# Patient Record
Sex: Female | Born: 2000
Health system: Southern US, Community
[De-identification: ages and names within clinical notes are randomized; demographics above are authoritative.]

## PROBLEM LIST (undated history)

## (undated) DIAGNOSIS — I10 Essential (primary) hypertension: Secondary | ICD-10-CM

---
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
## 2020-01-26 IMAGING — CR DG FOOT COMPLETE 3+V*R*
3 series · 3 of 3 positions shown · non-contrast
Comparison: None.

CLINICAL DATA: Foot pain

EXAM:
RIGHT FOOT COMPLETE - 3+ VIEW

[x foot lat right]
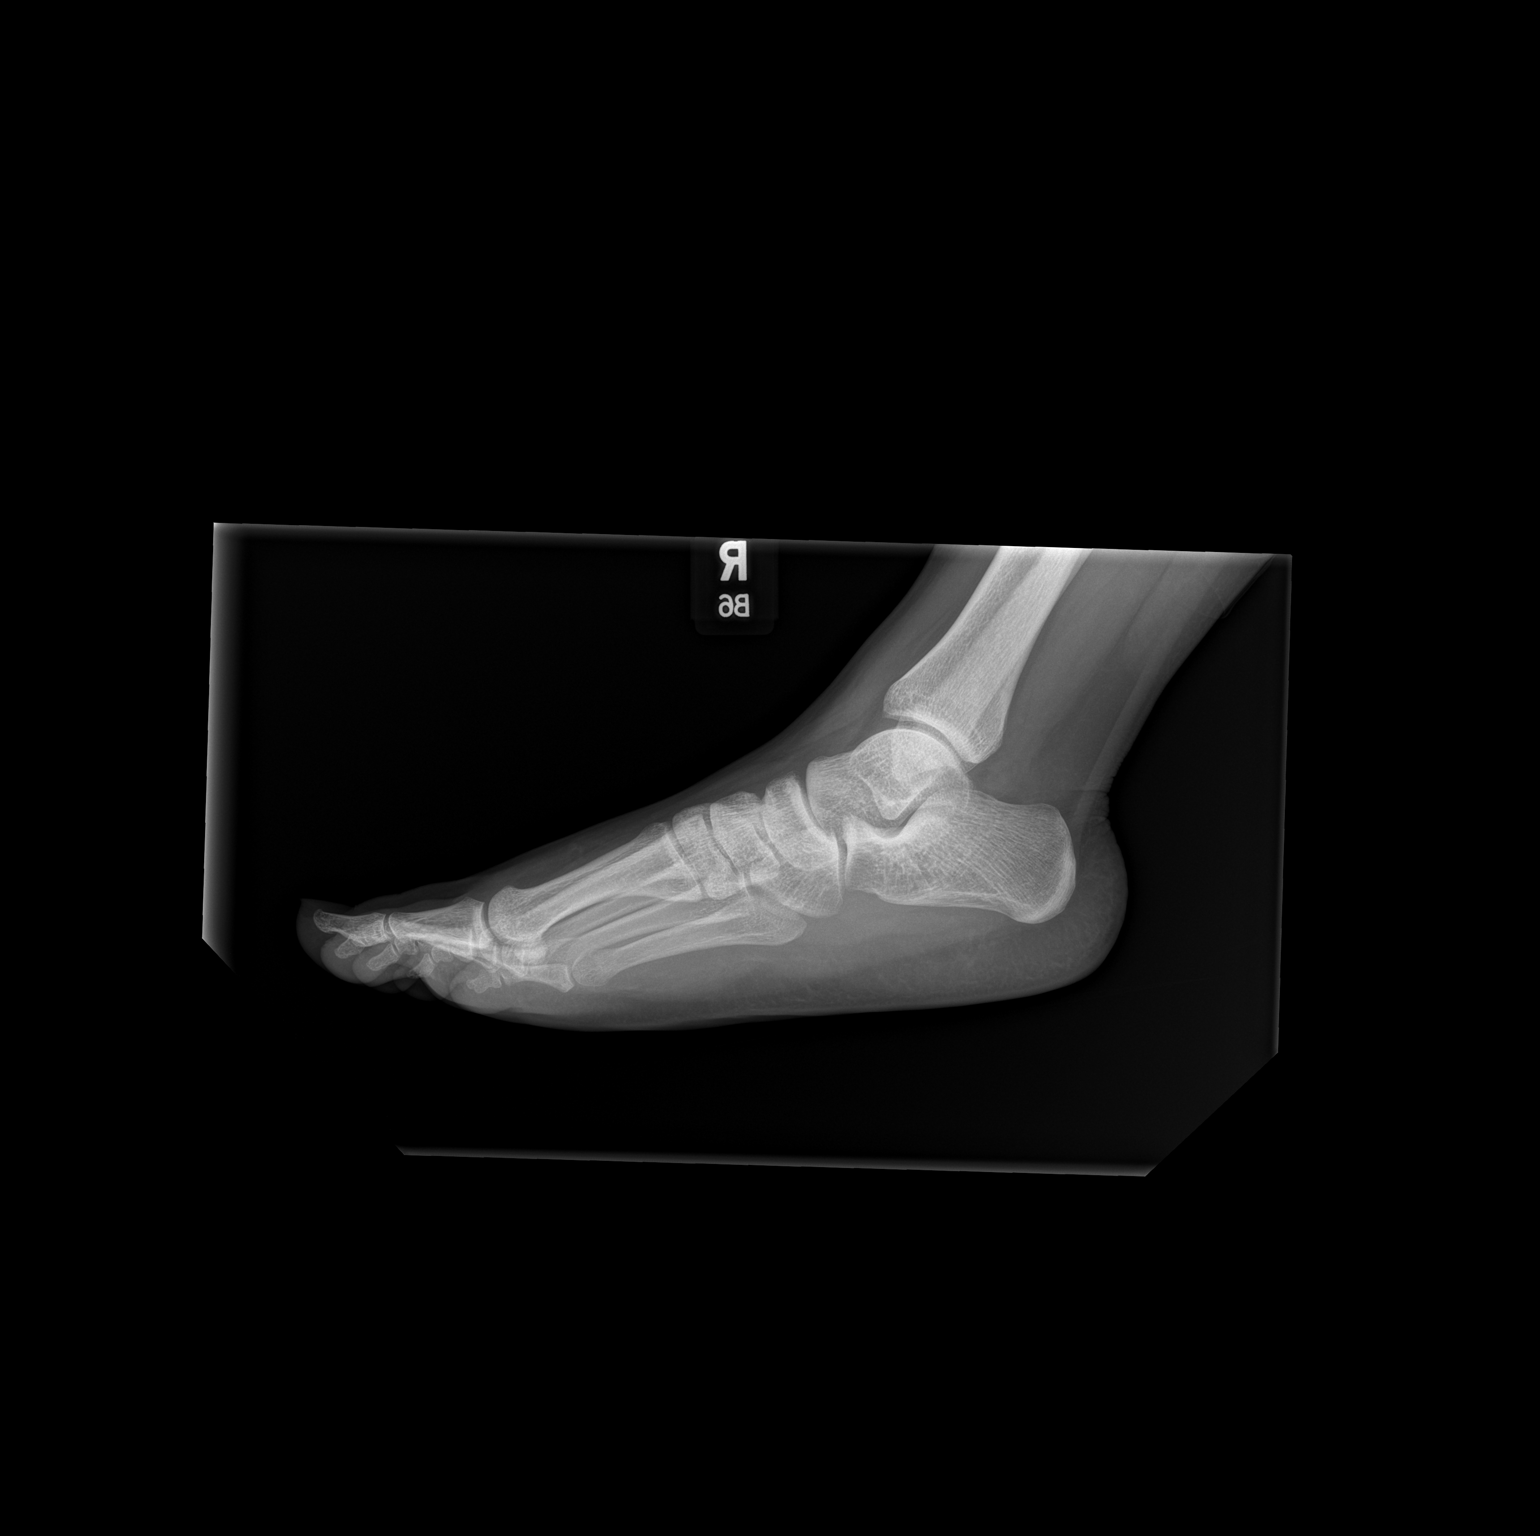

[x foot ap right]
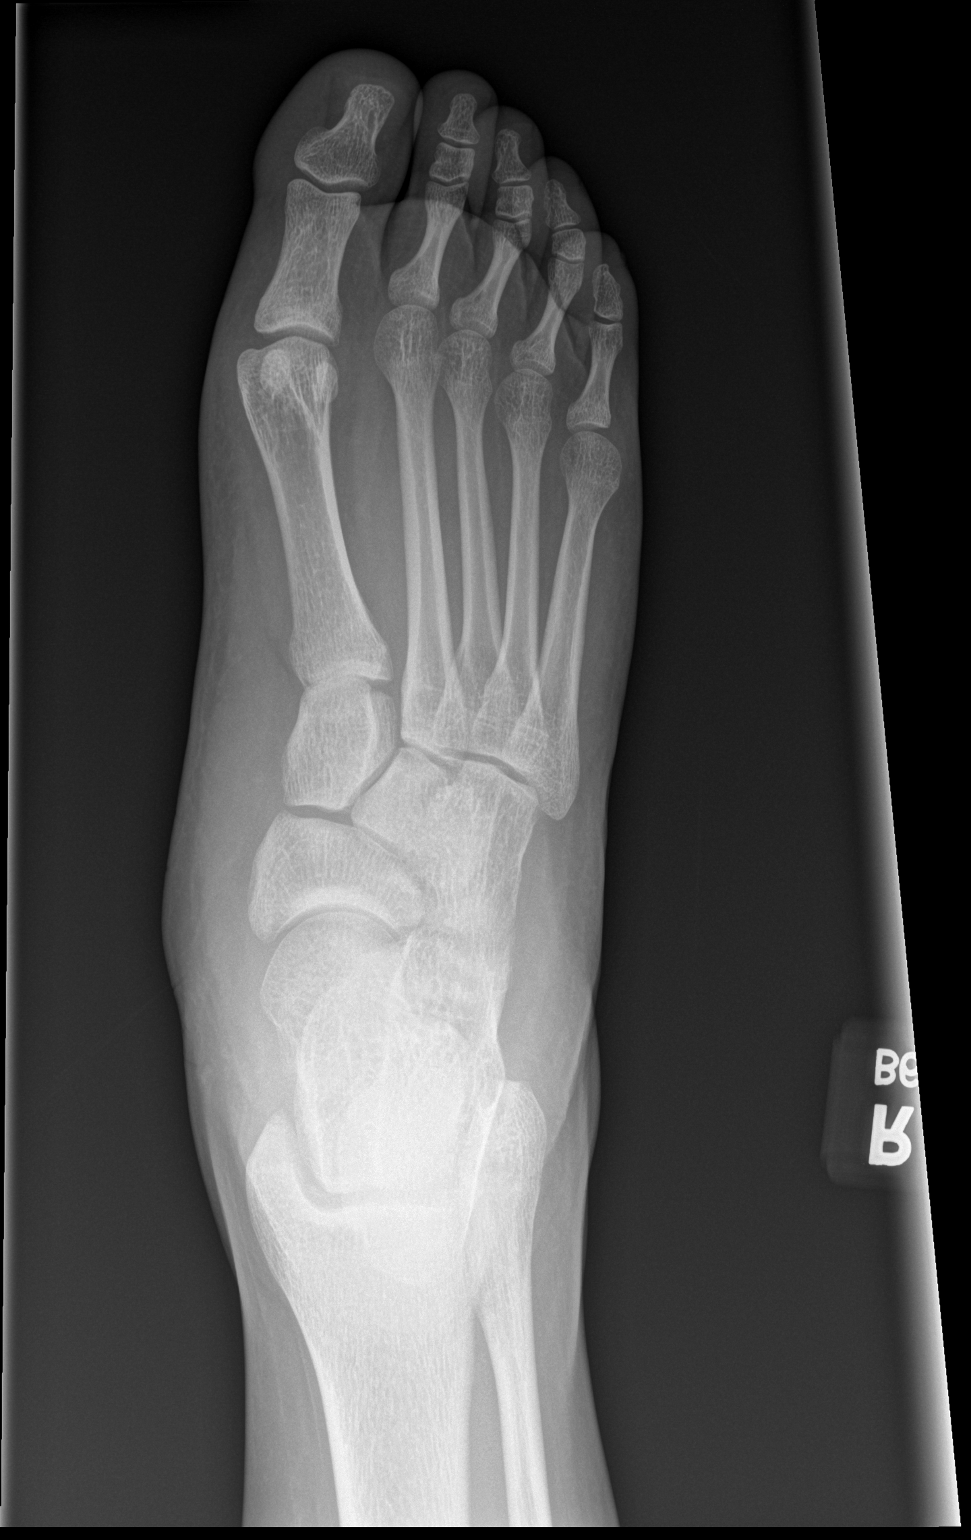

[x foot obl right]
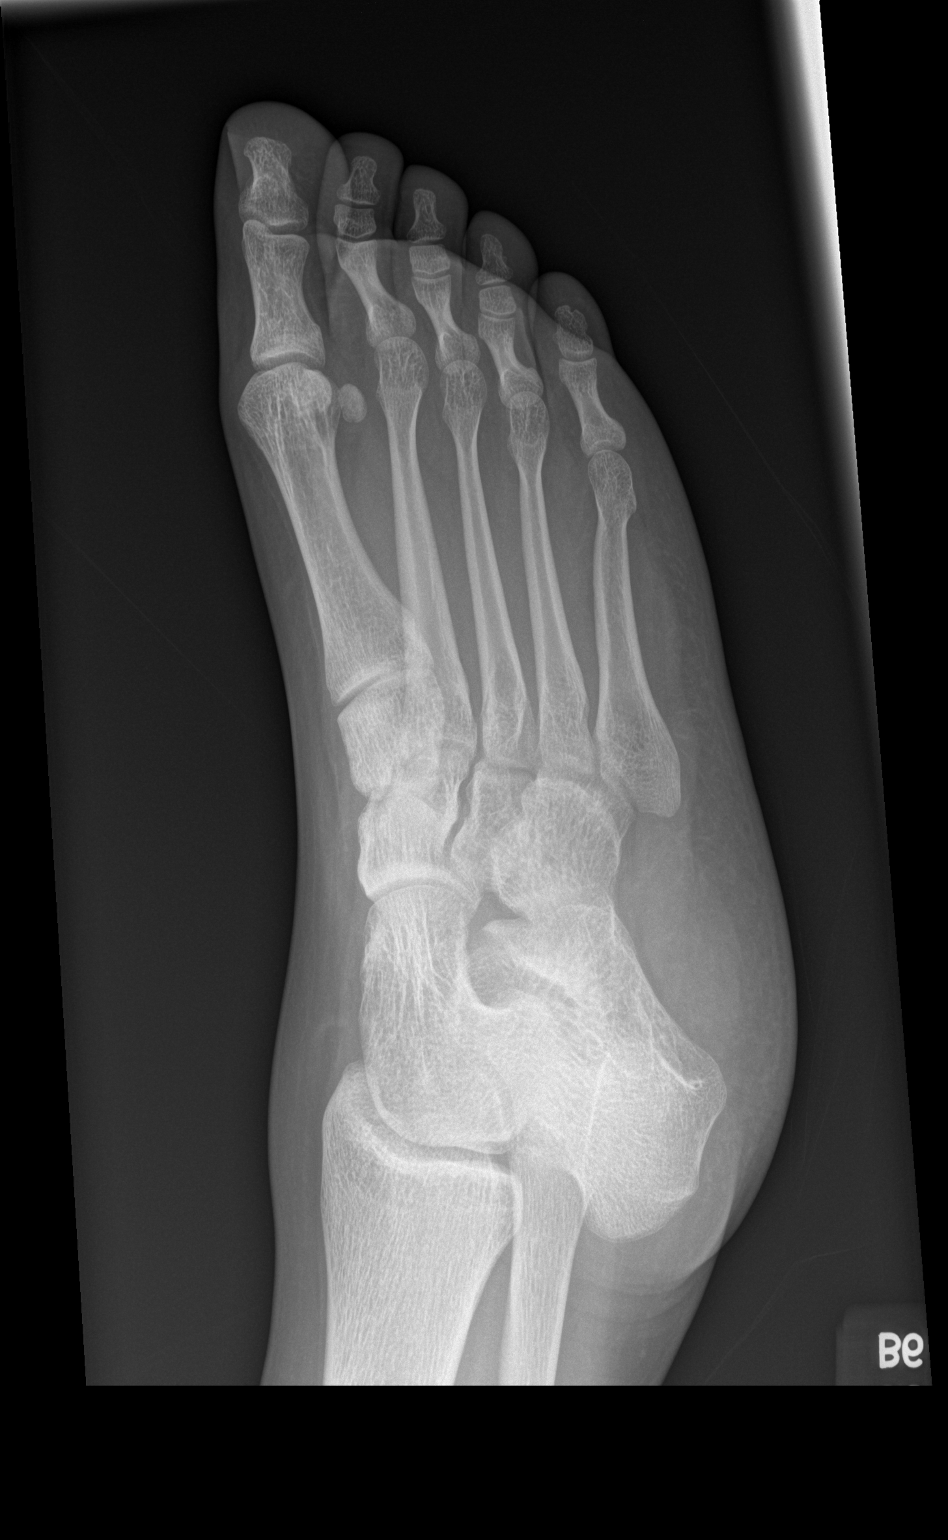

[3 of 3 positions shown; findings below may reference images not displayed]

FINDINGS: There is no evidence of fracture or dislocation. There is no
evidence of arthropathy or other focal bone abnormality. Soft
tissues are unremarkable.
IMPRESSION: Negative.

## 2020-01-26 IMAGING — CR DG ANKLE COMPLETE 3+V*R*
3 series · 3 of 3 positions shown · non-contrast
Comparison: None.

CLINICAL DATA: Ankle injury

EXAM:
RIGHT ANKLE - COMPLETE 3+ VIEW

[x ankle lat right]
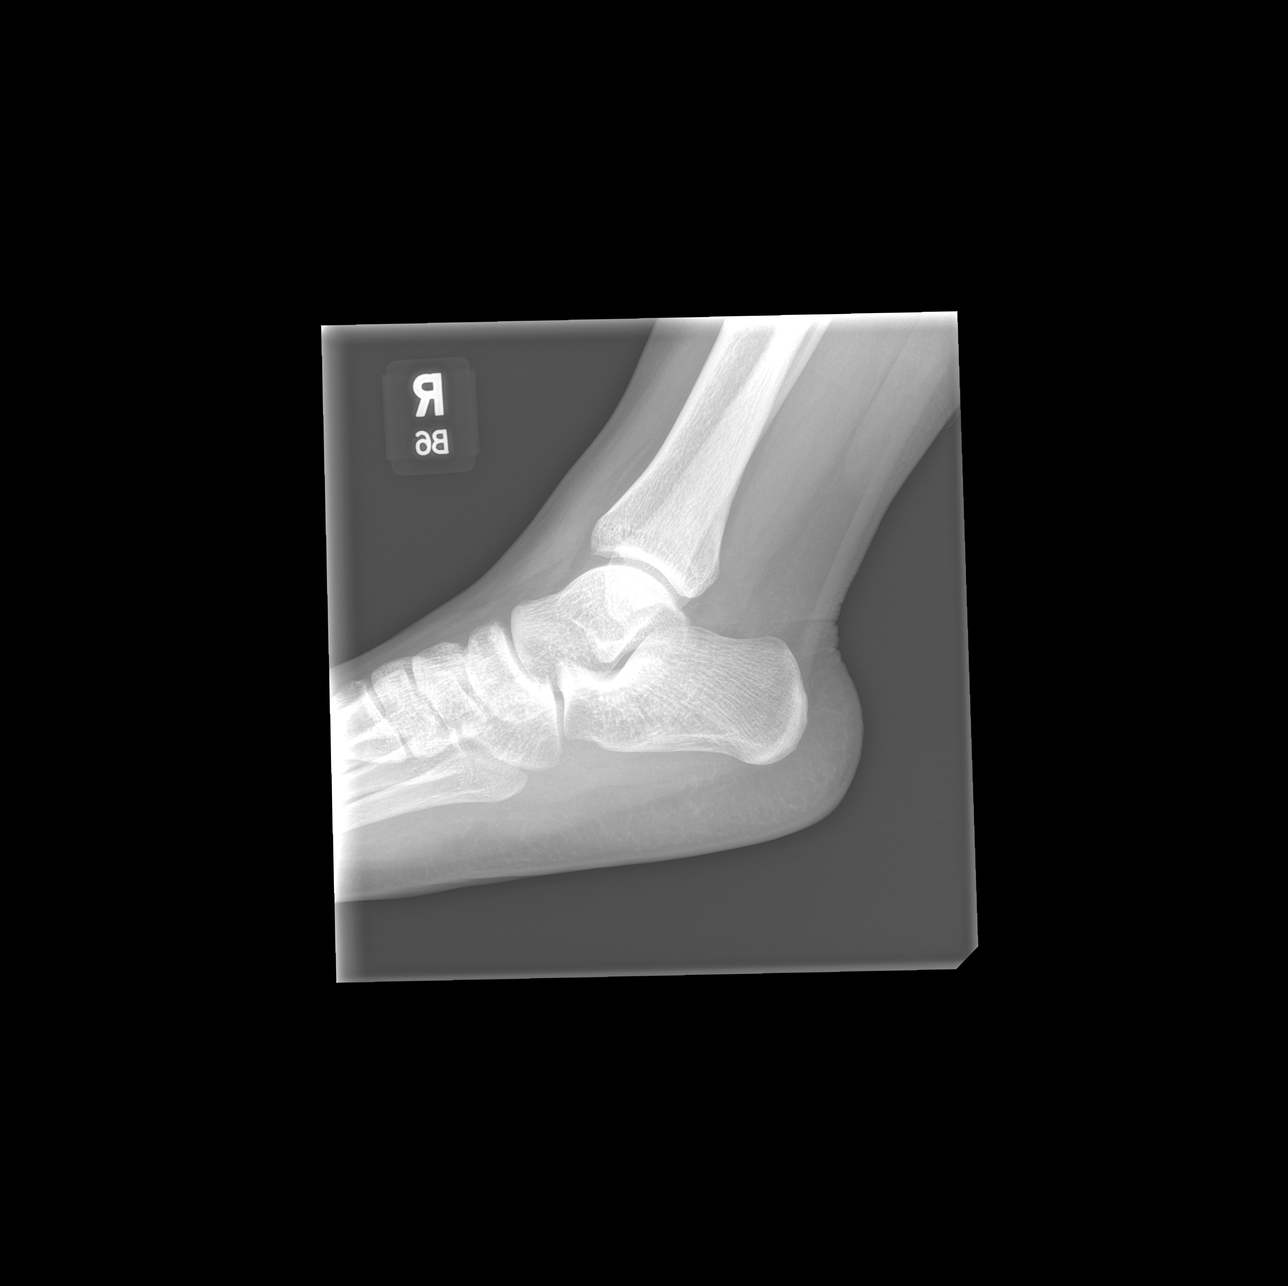

[x ankle ap right]
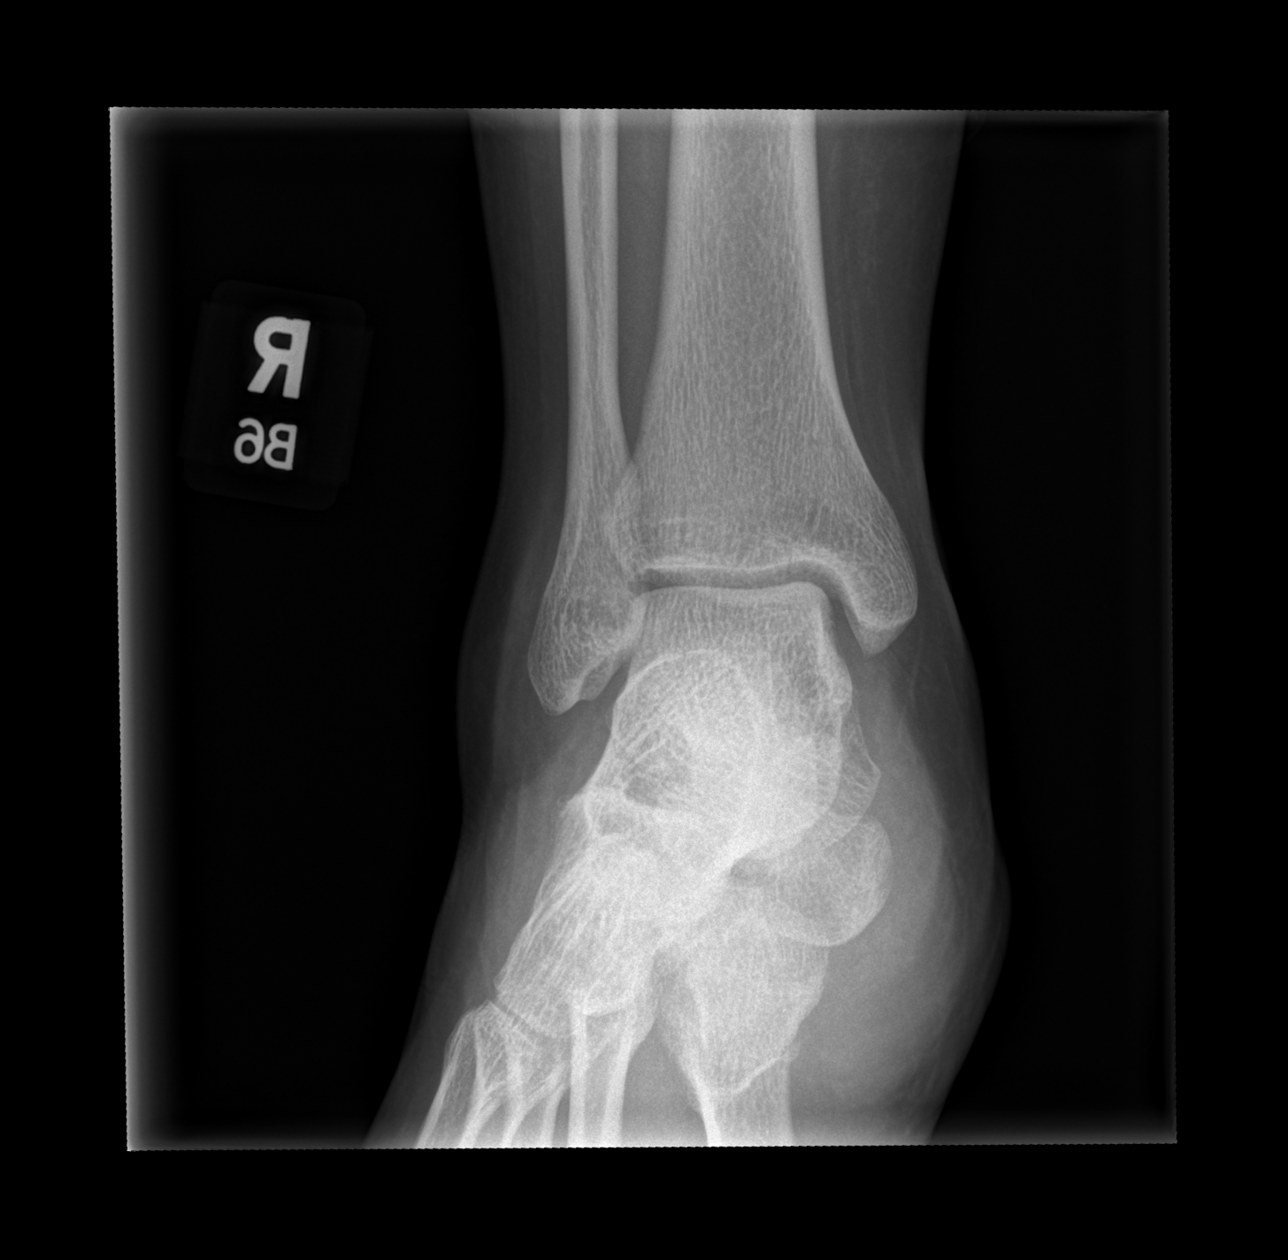

[x ankle obl right]
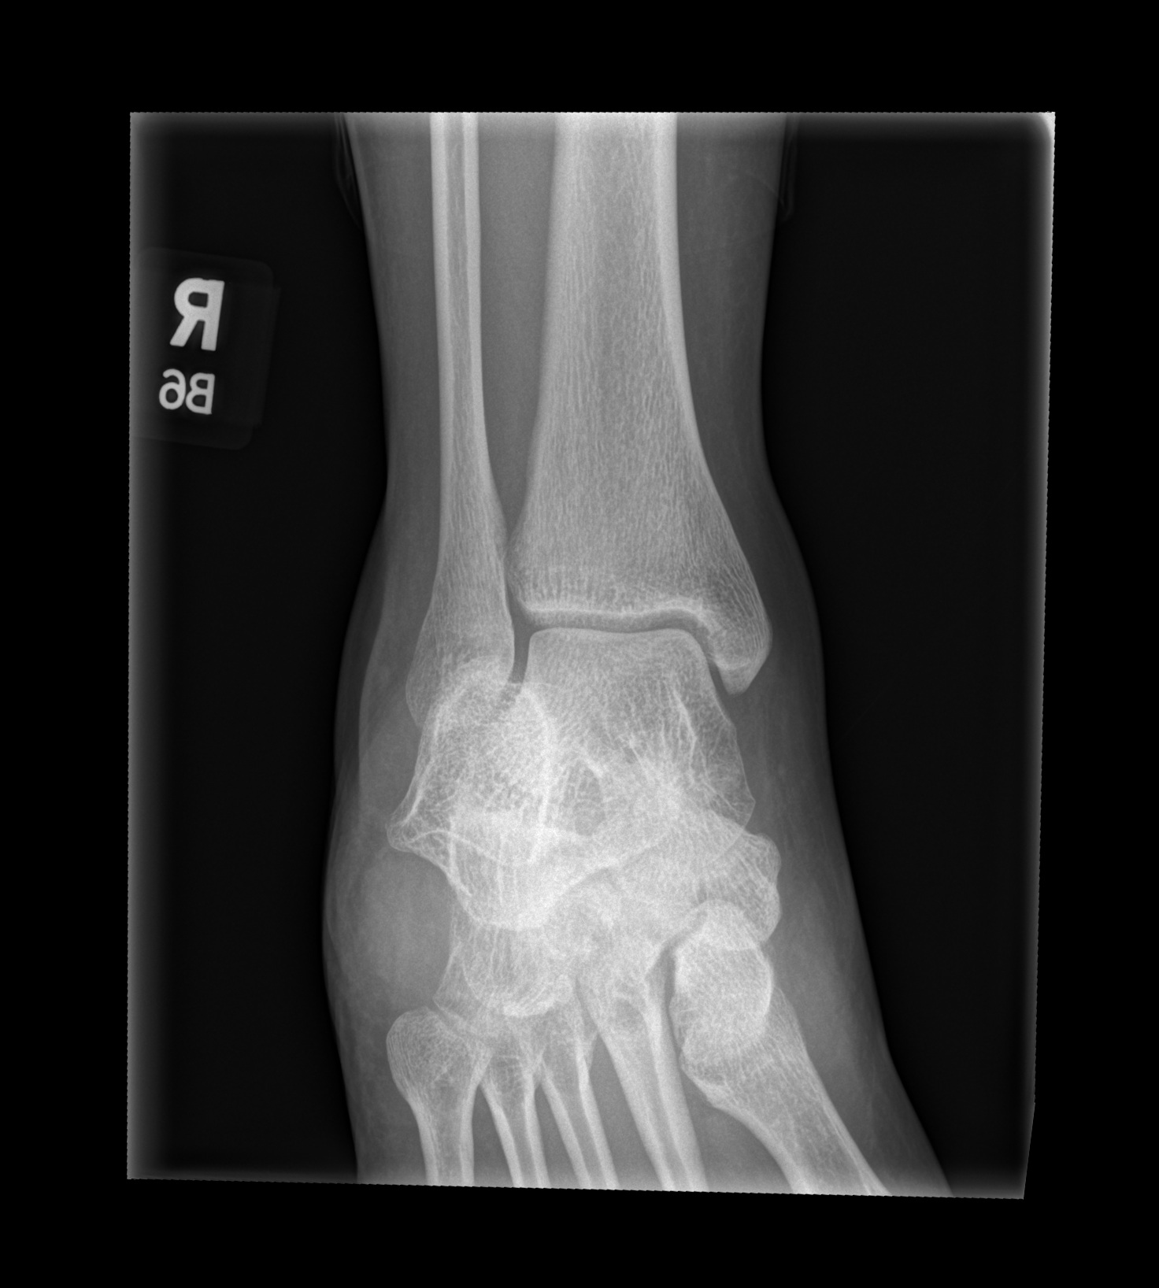

[3 of 3 positions shown; findings below may reference images not displayed]

FINDINGS: There is no evidence of fracture, dislocation, or joint effusion.
There is no evidence of arthropathy or other focal bone abnormality.
Soft tissues are unremarkable.
IMPRESSION: Negative.

## 2020-01-28 ENCOUNTER — Ambulatory Visit: Payer: 59

## 2020-02-04 ENCOUNTER — Other Ambulatory Visit: Payer: Self-pay

## 2020-02-04 ENCOUNTER — Encounter: Payer: Self-pay | Admitting: Family Medicine

## 2020-02-04 ENCOUNTER — Ambulatory Visit (INDEPENDENT_AMBULATORY_CARE_PROVIDER_SITE_OTHER): Payer: 59 | Admitting: Family Medicine

## 2020-02-04 VITALS — BP 137/85 | HR 81 | Temp 98.1°F | Ht 67.02 in | Wt 246.0 lb

## 2020-02-04 DIAGNOSIS — I10 Essential (primary) hypertension: Secondary | ICD-10-CM | POA: Diagnosis not present

## 2020-02-04 DIAGNOSIS — E559 Vitamin D deficiency, unspecified: Secondary | ICD-10-CM

## 2020-02-04 DIAGNOSIS — Z862 Personal history of diseases of the blood and blood-forming organs and certain disorders involving the immune mechanism: Secondary | ICD-10-CM

## 2020-02-04 MED ORDER — TRIAMTERENE-HCTZ 37.5-25 MG PO TABS: ORAL_TABLET | ORAL | 5 refills | Status: DC

## 2020-02-04 NOTE — Patient Instructions (Addendum)
° ° °  [facility] Attention Specialist  If you have lab work done today you will be contacted with your lab results within the next 2 weeks.  If you have not heard from [place] then please contact us. The fastest way to get your results is to register for My Chart.   IF you received an x-ray today, you will receive an invoice from [facility]. Please contact [facility] at [phone] with questions or concerns regarding your invoice.   IF you received labwork today, you will receive an invoice from [facility]. Please contact [facility] at [phone] with questions or concerns regarding your invoice.   Our billing staff will not be able to assist you with questions regarding bills from these companies.  You will be contacted with the lab results as soon as they are available. The fastest way to get your results is to activate your My Chart account. Instructions are located on the last page of this paperwork. If you have not heard from [place] regarding the results in 2 weeks, please contact this office.

## 2020-02-04 NOTE — Progress Notes (Signed)
6/14/202110:26 AM  Hannah Hammond 03/01/2001, 19 y.o., female [id number]  Chief Complaint  Patient presents with  . Hypertension    HPI:   Patient is a 19 y.o. female with past medical history significant for HTN and vitamin D deficiency who presents today for routine followup  Has a new job so has become active and has no time to snack Does not take BP meds daily, tends to take about twice a week, for home BP reading 140/90 or greater Takes iron daily Struggles with daily vitamin D Will be going to [employer] this fall, concerned about focus issues, wants to become a psychology major Has completed covid vaccine   Wt Readings from Last 3 Encounters:  02/04/20 246 lb (111.6 kg) (>99 %, Z= 2.43)*  09/14/19 260 lb (117.9 kg) (>99 %, Z= 2.51)*  09/05/18 257 lb 12.8 oz (116.9 kg) (>99 %, Z= 2.52)*   * Growth percentiles are based on CDC (Girls, 2-20 Years) data.   BP Readings from Last 3 Encounters:  02/04/20 137/85  09/05/18 (!) 129/77 (95 %, Z = 1.69 /  88 %, Z = 1.18)*  08/04/18 (!) 131/77 (97 %, Z = 1.92 /  88 %, Z = 1.18)*   *BP percentiles are based on the 2017 AAP Clinical Practice Guideline for girls    Depression screen [facility] 2/9 09/14/2019 05/08/2019 09/05/2018  Decreased Interest 0 0 0  Down, Depressed, Hopeless 0 0 0  PHQ - 2 Score 0 0 0    Fall Risk  09/14/2019 05/08/2019 09/05/2018 01/02/2018  Falls in the past year? 0 0 1 No  Number falls in past yr: - 0 0 -  Injury with Fall? - 0 1 -  Comment - - at school- right ankle -  Follow up Falls evaluation completed - - -     No Known Allergies  Prior to Admission medications   Medication Sig Start Date End Date Taking? Authorizing Provider  Ferrous Sulfate (IRON PO) Take by mouth daily.   Yes [provider]  Multiple Vitamin (MULTIVITAMIN) tablet Take 1 tablet by mouth daily.   Yes [provider]  nystatin cream (MYCOSTATIN) Apply 1 application topically 2 (two) times daily. 09/14/19  Yes  Rutherford Guys, MD  triamterene-hydrochlorothiazide [facility]) 37.5-25 MG tablet Take 1/2 (one-half) tablet by mouth once daily 09/14/19  Yes Rutherford Guys, MD    Past Medical History:  Diagnosis Date  . Hypertension     No past surgical history on file.  Social History   Tobacco Use  . Smoking status: Never Smoker  . Smokeless tobacco: Never Used  Substance Use Topics  . Alcohol use: No    Family History  Problem Relation Age of Onset  . Hypertension Mother   . Vitamin D deficiency Mother   . Diabetes Mellitus II Father   . Hyperlipidemia Father     Review of Systems  Constitutional: Negative for chills and fever.  Respiratory: Negative for cough and shortness of breath.   Cardiovascular: Negative for chest pain, palpitations and leg swelling.  Gastrointestinal: Negative for abdominal pain, nausea and vomiting.     OBJECTIVE:  Today's Vitals   02/04/20 1003  BP: 137/85  Pulse: 81  Temp: 98.1 F (36.7 C)  SpO2: 100%  Weight: 246 lb (111.6 kg)  Height: 5' 7.02" (1.702 m)   Body mass index is 38.51 kg/m.   Physical Exam Vitals and nursing note reviewed.  Constitutional:      Appearance:  She is well-developed.  HENT:     Head: Normocephalic and atraumatic.     Mouth/Throat:     Pharynx: No oropharyngeal exudate.  Eyes:     General: No scleral icterus.    Conjunctiva/sclera: Conjunctivae normal.     Pupils: Pupils are equal, round, and reactive to light.  Cardiovascular:     Rate and Rhythm: Normal rate and regular rhythm.     Heart sounds: Normal heart sounds. No murmur heard.  No friction rub. No gallop.   Pulmonary:     Effort: Pulmonary effort is normal.     Breath sounds: Normal breath sounds. No wheezing or rales.  Musculoskeletal:     Cervical back: Neck supple.  Skin:    General: Skin is warm and dry.  Neurological:     Mental Status: She is alert and oriented to person, place, and time.     No results found for this or any  previous visit (from the past 24 hour(s)).  No results found.   ASSESSMENT and PLAN  1. Vitamin D deficiency Checking labs today, medications will be adjusted as needed.  - VITAMIN D 25 Hydroxy (Vit-D Deficiency, Fractures)  2. Benign essential hypertension in pediatric patient Stable. Cont current regime and weight loss efforts - CMP14+EGFR - Lipid panel  3. H/O iron deficiency anemia - CBC - Iron, TIBC and Ferritin Panel  Other orders - triamterene-hydrochlorothiazide (MAXZIDE-25) 37.5-25 MG tablet; Take 1/2 (one-half) tablet by mouth once daily  Return in about 6 months (around 08/05/2020).    Rutherford Guys, MD Primary Care at [place], [place] [address] Ph.  [phone] Fax [phone]

## 2020-02-05 LAB — CMP14+EGFR
ALT: 9 IU/L (ref 0–32)
AST: 16 IU/L (ref 0–40)
Albumin/Globulin Ratio: 1.6 (ref 1.2–2.2)
Albumin: 4.4 g/dL (ref 3.9–5.0)
Alkaline Phosphatase: 107 IU/L — ABNORMAL HIGH (ref 45–106)
BUN/Creatinine Ratio: 11 (ref 9–23)
BUN: 10 mg/dL (ref 6–20)
Bilirubin Total: 0.4 mg/dL (ref 0.0–1.2)
CO2: 23 mmol/L (ref 20–29)
Calcium: 9.8 mg/dL (ref 8.7–10.2)
Chloride: 102 mmol/L (ref 96–106)
Creatinine, Ser: 0.92 mg/dL (ref 0.57–1.00)
GFR calc Af Amer: 105 mL/min/{1.73_m2} (ref >59)
GFR calc non Af Amer: 91 mL/min/{1.73_m2} (ref >59)
Globulin, Total: 2.8 g/dL (ref 1.5–4.5)
Glucose: 87 mg/dL (ref 65–99)
Potassium: 4 mmol/L (ref 3.5–5.2)
Sodium: 140 mmol/L (ref 134–144)
Total Protein: 7.2 g/dL (ref 6.0–8.5)

## 2020-02-05 LAB — IRON,TIBC AND FERRITIN PANEL
Ferritin: 41 ng/mL (ref 15–77)
Iron Saturation: 17 % (ref 15–55)
Iron: 58 ug/dL (ref 27–159)
Total Iron Binding Capacity: 339 ug/dL (ref 250–450)
UIBC: 281 ug/dL (ref 131–425)

## 2020-02-05 LAB — CBC
Hematocrit: 41.5 % (ref 34.0–46.6)
Hemoglobin: 12.7 g/dL (ref 11.1–15.9)
MCH: 26.3 pg — ABNORMAL LOW (ref 26.6–33.0)
MCHC: 30.6 g/dL — ABNORMAL LOW (ref 31.5–35.7)
MCV: 86 fL (ref 79–97)
Platelets: 245 10*3/uL (ref 150–450)
RBC: 4.82 x10E6/uL (ref 3.77–5.28)
RDW: 13.1 % (ref 11.7–15.4)
WBC: 9.1 10*3/uL (ref 3.4–10.8)

## 2020-02-05 LAB — LIPID PANEL
Chol/HDL Ratio: 3 ratio (ref 0.0–4.4)
Cholesterol, Total: 123 mg/dL (ref 100–169)
HDL: 41 mg/dL (ref >39)
LDL Chol Calc (NIH): 62 mg/dL (ref 0–109)
Triglycerides: 111 mg/dL — ABNORMAL HIGH (ref 0–89)
VLDL Cholesterol Cal: 20 mg/dL (ref 5–40)

## 2020-02-05 LAB — VITAMIN D 25 HYDROXY (VIT D DEFICIENCY, FRACTURES): Vit D, 25-Hydroxy: 24.9 ng/mL — ABNORMAL LOW (ref 30.0–100.0)

## 2020-02-15 ENCOUNTER — Other Ambulatory Visit: Payer: Self-pay | Admitting: Family Medicine

## 2020-02-15 ENCOUNTER — Encounter: Payer: Self-pay | Admitting: Radiology

## 2020-03-20 ENCOUNTER — Telehealth (INDEPENDENT_AMBULATORY_CARE_PROVIDER_SITE_OTHER): Payer: 59 | Admitting: Family Medicine

## 2020-03-20 ENCOUNTER — Encounter: Payer: Self-pay | Admitting: Family Medicine

## 2020-03-20 ENCOUNTER — Other Ambulatory Visit: Payer: Self-pay

## 2020-03-20 DIAGNOSIS — Z30011 Encounter for initial prescription of contraceptive pills: Secondary | ICD-10-CM | POA: Diagnosis not present

## 2020-03-20 MED ORDER — NORGESTIMATE-ETH ESTRADIOL 0.25-35 MG-MCG PO TABS
1.0000 | ORAL_TABLET | Freq: Every day | ORAL | 3 refills | Status: DC
Start: 2020-03-20 — End: 2021-02-13

## 2020-03-20 NOTE — Progress Notes (Signed)
   Virtual Visit Note  I connected with patient on 03/20/20 at 626pm by video epic and verified that I am speaking with the correct person using two identifiers. Hannah Hammond is currently located at home and patient is currently with them during visit. The provider, Myles Lipps, MD is located in their office at time of visit.  I discussed the limitations, risks, security and privacy concerns of performing an evaluation and management service by telephone and the availability of in person appointments. I also discussed with the patient that there may be a patient responsible charge related to this service. The patient expressed understanding and agreed to proceed.   I provided 20 minutes of non-face-to-face time during this encounter.  Chief Complaint  Patient presents with  . Contraception    wants to only discuss the diffrent forms of birth control for right now    HPI ? Patient would like to discuss [facility] options/starting Has never been sexually active LMP July 7th Menses regular, normal flow and duration No migraines/VTE Takes HCTZ prn  No Known Allergies  Prior to Admission medications   Medication Sig Start Date End Date Taking? Authorizing Provider  Multiple Vitamin (MULTIVITAMIN) tablet Take 1 tablet by mouth daily.   Yes [provider]  nystatin cream (MYCOSTATIN) Apply 1 application topically 2 (two) times daily. 09/14/19  Yes Myles Lipps, MD  triamterene-hydrochlorothiazide [facility]) 37.5-25 MG tablet Take 1/2 (one-half) tablet by mouth once daily 02/04/20  Yes Myles Lipps, MD    Past Medical History:  Diagnosis Date  . Hypertension     No past surgical history on file.  Social History   Tobacco Use  . Smoking status: Never Smoker  . Smokeless tobacco: Never Used  Substance Use Topics  . Alcohol use: No    Family History  Problem Relation Age of Onset  . Hypertension Mother   . Vitamin D deficiency Mother   . Diabetes  Mellitus II Father   . Hyperlipidemia Father     ROS Per hpi  Objective  Vitals as reported by the patient: none  GEN: AAOx3, NAD HEENT: [place]/AT, pupils are symmetrical, EOMI, non-icteric sclera Resp: breathing comfortably, speaking in full sentences Skin: no rashes noted, no pallor Psych: good eye contact, normal mood and affect   ASSESSMENT and PLAN  1. Encounter for initial prescription of contraceptive pills Discussed BC options r/se/b. Patient would like to start OCPs. Discussed starting with her next period. Discussed monitoring BP. Reviewed RTC precautions. Patient educational handout ready for pickup.  Other orders - norgestimate-ethinyl estradiol (ORTHO-CYCLEN) 0.25-35 MG-MCG tablet; Take 1 tablet by mouth daily.   FOLLOW-UP: 2 months   The above assessment and management plan was discussed with the patient. The patient verbalized understanding of and has agreed to the management plan. Patient is aware to call the clinic if symptoms persist or worsen. Patient is aware when to return to the clinic for a follow-up visit. Patient educated on when it is appropriate to go to the emergency department.     Myles Lipps, MD Primary Care at [facility] [address] [place], [place] [address] Ph.  [phone] Fax [phone]

## 2020-06-04 ENCOUNTER — Other Ambulatory Visit: Payer: Self-pay

## 2020-06-04 ENCOUNTER — Ambulatory Visit (INDEPENDENT_AMBULATORY_CARE_PROVIDER_SITE_OTHER): Payer: 59 | Admitting: Podiatry

## 2020-06-04 ENCOUNTER — Ambulatory Visit (INDEPENDENT_AMBULATORY_CARE_PROVIDER_SITE_OTHER): Payer: 59

## 2020-06-04 DIAGNOSIS — Q666 Other congenital valgus deformities of feet: Secondary | ICD-10-CM

## 2020-06-04 DIAGNOSIS — M25371 Other instability, right ankle: Secondary | ICD-10-CM | POA: Diagnosis not present

## 2020-06-04 DIAGNOSIS — S9031XA Contusion of right foot, initial encounter: Secondary | ICD-10-CM

## 2020-06-05 ENCOUNTER — Ambulatory Visit (INDEPENDENT_AMBULATORY_CARE_PROVIDER_SITE_OTHER): Payer: 59 | Admitting: Orthotics

## 2020-06-05 ENCOUNTER — Encounter: Payer: Self-pay | Admitting: Podiatry

## 2020-06-05 DIAGNOSIS — Q666 Other congenital valgus deformities of feet: Secondary | ICD-10-CM

## 2020-06-05 DIAGNOSIS — M25371 Other instability, right ankle: Secondary | ICD-10-CM

## 2020-06-05 NOTE — Progress Notes (Signed)
Subjective:  Patient ID: Hannah Hammond, female    DOB: August 29, 2000,  MRN: [id number]  Chief Complaint  Patient presents with  . Foot Pain    Pt stated that she is on her feet alot for work and she has some pain and like a knot on the side of her foot     19 y.o. female presents with the above complaint.  Patient presents with complaint of bilateral severe flatfoot with history of multiple ankle sprains.  Patient states that she works a lot on her foot and has pain all the time.  Patient states that pain is mostly in the ankle for now.  She does not have any heel pain.  She does have flatfoot bilaterally.  She denies seeing anyone else prior to seeing me.  She would like to discuss treatment options for this.  She states that sometimes she feels like her ankles clinically well/there is instability.  She would like to discuss treatment options her pain is mild and appears to be about 4 out of 10.   Review of Systems: Negative except as noted in the HPI. Denies N/V/F/Ch.  Past Medical History:  Diagnosis Date  . Hypertension     Current Outpatient Medications:  Marland Kitchen  Multiple Vitamin (MULTIVITAMIN) tablet, Take 1 tablet by mouth daily., Disp: , Rfl:  .  norgestimate-ethinyl estradiol (ORTHO-CYCLEN) 0.25-35 MG-MCG tablet, Take 1 tablet by mouth daily., Disp: 84 tablet, Rfl: 3 .  nystatin cream (MYCOSTATIN), Apply 1 application topically 2 (two) times daily., Disp: 30 g, Rfl: 5 .  triamterene-hydrochlorothiazide (MAXZIDE-25) 37.5-25 MG tablet, Take 1/2 (one-half) tablet by mouth once daily, Disp: 30 tablet, Rfl: 5  Current Facility-Administered Medications:  .  ketoconazole (NIZORAL) 2 % shampoo, , Topical, Once per day on Mon Thu, Santiago, Irma M, MD  Social History   Tobacco Use  Smoking Status Never Smoker  Smokeless Tobacco Never Used    No Known Allergies Objective:  There were no vitals filed for this visit. There is no height or weight on file to calculate  BMI. Constitutional Well developed. Well nourished.  Vascular Dorsalis pedis pulses palpable bilaterally. Posterior tibial pulses palpable bilaterally. Capillary refill normal to all digits.  No cyanosis or clubbing noted. Pedal hair growth normal.  Neurologic Normal speech. Oriented to person, place, and time. Epicritic sensation to light touch grossly present bilaterally.  Dermatologic Nails well groomed and normal in appearance. No open wounds. No skin lesions.  Orthopedic:  Pain on palpation to the ATFL ligament.  Mild pain with plantarflexion inversion of the foot.  No pain with dorsiflexion eversion of the foot.  Negative anterior drawer test or talar tilt test.  No heel pain.  No pain at the Achilles tendon, peroneal tendon, posterior tibial tendon.  Gait examination shows severe pes planovalgus deformity rigid in nature unable to recreate the arch of the foot with dorsiflexion of the hallux.  Too many toe signs.  Unable to return to neutral position on single and double heel raises   Radiographs: 3 views of skeletally mature adult right ankle: Ankle mortise appears to be within normal limits.  No arthritic changes noted.  No bone abnormalities identified. Assessment:   1. Pes planovalgus   2. Ankle instability, right    Plan:  Patient was evaluated and treated and all questions answered.  Right chronic ankle instability -I explained to the patient the etiology of chronic ankle instability especially in the setting of history of multiple ankle sprain and various treatment  options were discussed.  I believe that patient will ultimately benefit from custom-made orthotics to help support the minimal arch of her foot control the hindfoot motion and therefore may be prevent the feeling of instability.  Patient already has a Tri-Lock ankle brace that I encouraged her to continue utilizing.  Patient states understanding  Severe rigid pes planovalgus -I explained patient the etiology of  pes planovalgus and various treatment options were discussed.  I believe patient will benefit from custom-made orthotics to address the pes planovalgus deformity and support the arch and the hindfoot.  Patient states understanding she would like to obtain orthotics -Should be scheduled see rec for custom-made orthotics -If there is no improvement patient is a surgical candidate for flatfoot reconstruction given the age as well as the severe nature of the flatfoot.  No follow-ups on file.

## 2020-06-05 NOTE — Progress Notes (Signed)
Patient is being seen today for f/o to address congential pes planus/pes planovalgus. Patient is active youth and demonstrates over pronation in gait, prominent medially shifted talus, and collapse of medial column.  Goals are RF stability, longitudinal arch support, decrease in pronation, and ease of discomfort in mobility related activities.   

## 2020-07-31 ENCOUNTER — Other Ambulatory Visit: Payer: Self-pay

## 2020-07-31 ENCOUNTER — Encounter: Payer: Self-pay | Admitting: Family Medicine

## 2020-07-31 ENCOUNTER — Ambulatory Visit (INDEPENDENT_AMBULATORY_CARE_PROVIDER_SITE_OTHER): Payer: 59 | Admitting: Family Medicine

## 2020-07-31 VITALS — BP 144/90 | HR 79 | Temp 98.1°F | Ht 67.0 in | Wt 251.0 lb

## 2020-07-31 DIAGNOSIS — I10 Essential (primary) hypertension: Secondary | ICD-10-CM | POA: Diagnosis not present

## 2020-07-31 DIAGNOSIS — Z30011 Encounter for initial prescription of contraceptive pills: Secondary | ICD-10-CM | POA: Diagnosis not present

## 2020-07-31 DIAGNOSIS — E559 Vitamin D deficiency, unspecified: Secondary | ICD-10-CM | POA: Diagnosis not present

## 2020-07-31 DIAGNOSIS — Z833 Family history of diabetes mellitus: Secondary | ICD-10-CM | POA: Diagnosis not present

## 2020-07-31 DIAGNOSIS — Z23 Encounter for immunization: Secondary | ICD-10-CM | POA: Diagnosis not present

## 2020-07-31 DIAGNOSIS — Z862 Personal history of diseases of the blood and blood-forming organs and certain disorders involving the immune mechanism: Secondary | ICD-10-CM

## 2020-07-31 LAB — CBC

## 2020-07-31 MED ORDER — TRIAMTERENE-HCTZ 37.5-25 MG PO TABS: ORAL_TABLET | ORAL | 3 refills | Status: DC

## 2020-07-31 NOTE — Patient Instructions (Addendum)
  Health Maintenance, Female Adopting a healthy lifestyle and getting preventive care are important in promoting health and wellness. Ask your health care provider about:  The right schedule for you to have regular tests and exams.  Things you can do on your own to prevent diseases and keep yourself healthy. What should I know about diet, weight, and exercise? Eat a healthy diet   Eat a diet that includes plenty of vegetables, fruits, low-fat dairy products, and lean protein.  Do not eat a lot of foods that are high in solid fats, added sugars, or sodium. Maintain a healthy weight Body mass index (BMI) is used to identify weight problems. It estimates body fat based on height and weight. Your health care provider can help determine your BMI and help you achieve or maintain a healthy weight. Get regular exercise Get regular exercise. This is one of the most important things you can do for your health. Most adults should:  Exercise for at least 150 minutes each week. The exercise should increase your heart rate and make you sweat (moderate-intensity exercise).  Do strengthening exercises at least twice a week. This is in addition to the moderate-intensity exercise.  Spend less time sitting. Even light physical activity can be beneficial. Watch cholesterol and blood lipids Have your blood tested for lipids and cholesterol at 20 years of age, then have this test every 5 years. Have your cholesterol levels checked more often if:  Your lipid or cholesterol levels are high.  You are older than 19 years of age.  You are at high risk for heart disease. What should I know about cancer screening? Depending on your health history and family history, you may need to have cancer screening at various ages. This may include screening for:  Breast cancer.  Cervical cancer.  Colorectal cancer.  Skin cancer.  Lung cancer. What should I know about heart disease, diabetes, and high blood  pressure? Blood pressure and heart disease  High blood pressure causes heart disease and increases the risk of stroke. This is more likely to develop in people who have high blood pressure readings, are of African descent, or are overweight.  Have your blood pressure checked: ? Every 3-5 years if you are 18-39 years of age. ? Every year if you are 40 years old or older. Diabetes Have regular diabetes screenings. This checks your fasting blood sugar level. Have the screening done:  Once every three years after age 40 if you are at a normal weight and have a low risk for diabetes.  More often and at a younger age if you are overweight or have a high risk for diabetes. What should I know about preventing infection? Hepatitis B If you have a higher risk for hepatitis B, you should be screened for this virus. Talk with your health care provider to find out if you are at risk for hepatitis B infection. Hepatitis C Testing is recommended for:  Everyone born from 1945 through 1965.  Anyone with known risk factors for hepatitis C. Sexually transmitted infections (STIs)  Get screened for STIs, including gonorrhea and chlamydia, if: ? You are sexually active and are younger than 19 years of age. ? You are older than 19 years of age and your health care provider tells you that you are at risk for this type of infection. ? Your sexual activity has changed since you were last screened, and you are at increased risk for chlamydia or gonorrhea. Ask your health care   provider if you are at risk.  Ask your health care provider about whether you are at high risk for HIV. Your health care provider may recommend a prescription medicine to help prevent HIV infection. If you choose to take medicine to prevent HIV, you should first get tested for HIV. You should then be tested every 3 months for as long as you are taking the medicine. Pregnancy  If you are about to stop having your period (premenopausal) and  you may become pregnant, seek counseling before you get pregnant.  Take 400 to 800 micrograms (mcg) of folic acid every day if you become pregnant.  Ask for birth control (contraception) if you want to prevent pregnancy. Osteoporosis and menopause Osteoporosis is a disease in which the bones lose minerals and strength with aging. This can result in bone fractures. If you are 65 years old or older, or if you are at risk for osteoporosis and fractures, ask your health care provider if you should:  Be screened for bone loss.  Take a calcium or vitamin D supplement to lower your risk of fractures.  Be given hormone replacement therapy (HRT) to treat symptoms of menopause. Follow these instructions at home: Lifestyle  Do not use any products that contain nicotine or tobacco, such as cigarettes, e-cigarettes, and chewing tobacco. If you need help quitting, ask your health care provider.  Do not use street drugs.  Do not share needles.  Ask your health care provider for help if you need support or information about quitting drugs. Alcohol use  Do not drink alcohol if: ? Your health care provider tells you not to drink. ? You are pregnant, may be pregnant, or are planning to become pregnant.  If you drink alcohol: ? Limit how much you use to 0-1 drink a day. ? Limit intake if you are breastfeeding.  Be aware of how much alcohol is in your drink. In the [place] one drink equals one 12 oz bottle of beer (355 mL), one 5 oz glass of wine (148 mL), or one 1 oz glass of hard liquor (44 mL). General instructions  Schedule regular health, dental, and eye exams.  Stay current with your vaccines.  Tell your health care provider if: ? You often feel depressed. ? You have ever been abused or do not feel safe at home. Summary  Adopting a healthy lifestyle and getting preventive care are important in promoting health and wellness.  Follow your health care provider's instructions about healthy  diet, exercising, and getting tested or screened for diseases.  Follow your health care provider's instructions on monitoring your cholesterol and blood pressure. This information is not intended to replace advice given to you by your health care provider. Make sure you discuss any questions you have with your health care provider. Document Revised: 08/02/2018 Document Reviewed: 08/02/2018 Elsevier Patient Education  2020 [employer]   If you have lab work done today you will be contacted with your lab results within the next 2 weeks.  If you have not heard from us then please contact us. The fastest way to get your results is to register for My Chart.   IF you received an x-ray today, you will receive an invoice from [facility]. Please contact [facility] at [phone] with questions or concerns regarding your invoice.   IF you received labwork today, you will receive an invoice from [facility]. Please contact [facility] at [phone] with questions or concerns regarding your invoice.   Our billing staff   will not be able to assist you with questions regarding bills from these companies.  You will be contacted with the lab results as soon as they are available. The fastest way to get your results is to activate your My Chart account. Instructions are located on the last page of this paperwork. If you have not heard from us regarding the results in 2 weeks, please contact this office.         If you have lab work done today you will be contacted with your lab results within the next 2 weeks.  If you have not heard from us then please contact us. The fastest way to get your results is to register for My Chart.   IF you received an x-ray today, you will receive an invoice from [facility]. Please contact [facility] at [phone] with questions or concerns regarding your invoice.   IF you received labwork today, you will receive an invoice from  [facility]. Please contact [facility] at [phone] with questions or concerns regarding your invoice.   Our billing staff will not be able to assist you with questions regarding bills from these companies.  You will be contacted with the lab results as soon as they are available. The fastest way to get your results is to activate your My Chart account. Instructions are located on the last page of this paperwork. If you have not heard from us regarding the results in 2 weeks, please contact this office.      

## 2020-07-31 NOTE — Progress Notes (Signed)
12/9/20218:42 AM  Hannah Hammond 01-31-2001, 19 y.o., female [id number]  Chief Complaint  Patient presents with  . Follow-up    6 month f/u and blood work- pt in college coming in on her break    HPI:   Patient is a 19 y.o. female with past medical history significant for HTN and vitamin D deficiency who presents today for routine follow-up.  Goes to school in [place]. Psychology degree. Normally works part time  LMP: 11/22 Has not started taking birth control Not sexually active Periods regular, not heavy, not painful  Dentist: regularly Eye: regulary  HTN Triamterene-HCTZ 37.5-25 Has been on this for years Goal< 130/80 Normally 120/70's Takes BP daily at home Most often less than 130/80  BP Readings from Last 3 Encounters:  07/31/20 (!) 144/90  02/04/20 137/85  09/05/18 (!) 129/77 (97 %, Z = 1.88 /  89 %, Z = 1.23)*   *BP percentiles are based on the 2017 AAP Clinical Practice Guideline for girls   Working on weight loss Wt Readings from Last 3 Encounters:  07/31/20 251 lb (113.9 kg) (>99 %, Z= 2.48)*  02/04/20 246 lb (111.6 kg) (>99 %, Z= 2.43)*  09/14/19 260 lb (117.9 kg) (>99 %, Z= 2.51)*   * Growth percentiles are based on CDC (Girls, 2-20 Years) data.    Depression screen [facility] 2/9 07/31/2020 09/14/2019 05/08/2019  Decreased Interest 0 0 0  Down, Depressed, Hopeless 0 0 0  PHQ - 2 Score 0 0 0    Fall Risk  07/31/2020 09/14/2019 05/08/2019 09/05/2018 01/02/2018  Falls in the past year? 0 0 0 1 No  Number falls in past yr: 0 - 0 0 -  Injury with Fall? 0 - 0 1 -  Comment - - - at school- right ankle -  Follow up Falls evaluation completed Falls evaluation completed - - -     No Known Allergies  Prior to Admission medications   Medication Sig Start Date End Date Taking? Authorizing Provider  nystatin cream (MYCOSTATIN) Apply 1 application topically 2 (two) times daily. 09/14/19  Yes Rutherford Guys, MD  triamterene-hydrochlorothiazide  [facility]) 37.5-25 MG tablet Take 1/2 (one-half) tablet by mouth once daily 02/04/20  Yes Rutherford Guys, MD  Multiple Vitamin (MULTIVITAMIN) tablet Take 1 tablet by mouth daily. Patient not taking: Reported on 07/31/2020    [provider]  norgestimate-ethinyl estradiol (ORTHO-CYCLEN) 0.25-35 MG-MCG tablet Take 1 tablet by mouth daily. Patient not taking: Reported on 07/31/2020 03/20/20   Rutherford Guys, MD    Past Medical History:  Diagnosis Date  . Hypertension     History reviewed. No pertinent surgical history.  Social History   Tobacco Use  . Smoking status: Never Smoker  . Smokeless tobacco: Never Used  Substance Use Topics  . Alcohol use: No    Family History  Problem Relation Age of Onset  . Hypertension Mother   . Vitamin D deficiency Mother   . Diabetes Mellitus II Father   . Hyperlipidemia Father     Review of Systems  Constitutional: Negative for chills, fever and malaise/fatigue.  Eyes: Negative for blurred vision and double vision.  Respiratory: Negative for cough, shortness of breath and wheezing.   Cardiovascular: Negative for chest pain, palpitations and leg swelling.  Gastrointestinal: Negative for abdominal pain, blood in stool, constipation, diarrhea, heartburn, nausea and vomiting.  Genitourinary: Negative for dysuria, frequency and hematuria.  Musculoskeletal: Negative for back pain and joint pain.  Skin: Negative for  rash.  Neurological: Negative for dizziness, weakness and headaches.     OBJECTIVE:  Today's Vitals   07/31/20 0803  BP: (!) 144/90  Pulse: 79  Temp: 98.1 F (36.7 C)  SpO2: 100%  Weight: 251 lb (113.9 kg)  Height: 5' 7"  (1.702 m)   Body mass index is 39.31 kg/m.   Physical Exam Constitutional:      General: She is not in acute distress.    Appearance: Normal appearance. She is not ill-appearing.  HENT:     Head: Normocephalic.     Right Ear: Tympanic membrane, ear canal and external ear normal.      Left Ear: Tympanic membrane, ear canal and external ear normal.  Eyes:     Extraocular Movements: Extraocular movements intact.     Conjunctiva/sclera: Conjunctivae normal.     Pupils: Pupils are equal, round, and reactive to light.  Cardiovascular:     Rate and Rhythm: Normal rate and regular rhythm.     Pulses: Normal pulses.     Heart sounds: Normal heart sounds. No murmur heard. No friction rub. No gallop.   Pulmonary:     Effort: Pulmonary effort is normal. No respiratory distress.     Breath sounds: Normal breath sounds. No stridor. No wheezing, rhonchi or rales.  Abdominal:     General: Bowel sounds are normal.     Palpations: Abdomen is soft.     Tenderness: There is no abdominal tenderness.  Musculoskeletal:     Right lower leg: No edema.     Left lower leg: No edema.  Skin:    General: Skin is warm and dry.  Neurological:     Mental Status: She is alert and oriented to person, place, and time.  Psychiatric:        Mood and Affect: Mood normal.        Behavior: Behavior normal.     No results found for this or any previous visit (from the past 24 hour(s)).  No results found.   ASSESSMENT and PLAN  Problem List Items Addressed This Visit   None   Visit Diagnoses    Vitamin D deficiency    -  Primary   Relevant Orders   VITAMIN D 25 Hydroxy (Vit-D Deficiency, Fractures)   Benign essential hypertension in pediatric patient       Relevant Medications   triamterene-hydrochlorothiazide (MAXZIDE-25) 37.5-25 MG tablet BP in clinic not at goal, home readings < 130/80 Will continue to monitor   Other Relevant Orders   CMP14+EGFR   Lipid panel   Encounter for initial prescription of contraceptive pills       FHx: type 2 diabetes mellitus       Relevant Orders   Hemoglobin A1c   H/O iron deficiency anemia       Relevant Orders   Iron, TIBC and Ferritin Panel   CBC  Currently does not take Birth control prescription She has on hand in case she becomes  sexually active Discussed the importance of taking this early to prevent pregnancy  Flu Vaccine done this visit     Will follow up with lab results Return in about 6 months (around 01/29/2021).   Huston Foley Cordarrell Sane, FNP-BC [facility] [place], [place] [address] Ph.  [phone] Fax [phone]

## 2020-08-01 LAB — CMP14+EGFR
ALT: 17 IU/L (ref 0–32)
AST: 18 IU/L (ref 0–40)
Albumin/Globulin Ratio: 1.7 (ref 1.2–2.2)
Albumin: 4.6 g/dL (ref 3.9–5.0)
Alkaline Phosphatase: 105 IU/L (ref 42–106)
BUN/Creatinine Ratio: 11 (ref 9–23)
BUN: 9 mg/dL (ref 6–20)
Bilirubin Total: 0.4 mg/dL (ref 0.0–1.2)
CO2: 25 mmol/L (ref 20–29)
Calcium: 9.6 mg/dL (ref 8.7–10.2)
Chloride: 100 mmol/L (ref 96–106)
Creatinine, Ser: 0.85 mg/dL (ref 0.57–1.00)
GFR calc Af Amer: 116 mL/min/{1.73_m2} (ref >59)
GFR calc non Af Amer: 100 mL/min/{1.73_m2} (ref >59)
Globulin, Total: 2.7 g/dL (ref 1.5–4.5)
Glucose: 83 mg/dL (ref 65–99)
Potassium: 3.9 mmol/L (ref 3.5–5.2)
Sodium: 138 mmol/L (ref 134–144)
Total Protein: 7.3 g/dL (ref 6.0–8.5)

## 2020-08-01 LAB — IRON,TIBC AND FERRITIN PANEL
Ferritin: 31 ng/mL (ref 15–77)
Iron Saturation: 13 % — ABNORMAL LOW (ref 15–55)
Iron: 44 ug/dL (ref 27–159)
Total Iron Binding Capacity: 332 ug/dL (ref 250–450)
UIBC: 288 ug/dL (ref 131–425)

## 2020-08-01 LAB — CBC
Hematocrit: 42.2 % (ref 34.0–46.6)
Hemoglobin: 12.9 g/dL (ref 11.1–15.9)
MCH: 25.7 pg — ABNORMAL LOW (ref 26.6–33.0)
MCHC: 30.6 g/dL — ABNORMAL LOW (ref 31.5–35.7)
MCV: 84 fL (ref 79–97)
Platelets: 311 10*3/uL (ref 150–450)
RBC: 5.02 x10E6/uL (ref 3.77–5.28)
RDW: 13.4 % (ref 11.7–15.4)
WBC: 9.4 10*3/uL (ref 3.4–10.8)

## 2020-08-01 LAB — LIPID PANEL
Chol/HDL Ratio: 3.1 ratio (ref 0.0–4.4)
Cholesterol, Total: 140 mg/dL (ref 100–169)
HDL: 45 mg/dL (ref >39)
LDL Chol Calc (NIH): 78 mg/dL (ref 0–109)
Triglycerides: 92 mg/dL — ABNORMAL HIGH (ref 0–89)
VLDL Cholesterol Cal: 17 mg/dL (ref 5–40)

## 2020-08-01 LAB — HEMOGLOBIN A1C
Est. average glucose Bld gHb Est-mCnc: 103 mg/dL
Hgb A1c MFr Bld: 5.2 % (ref 4.8–5.6)

## 2020-08-01 LAB — VITAMIN D 25 HYDROXY (VIT D DEFICIENCY, FRACTURES): Vit D, 25-Hydroxy: 18.3 ng/mL — ABNORMAL LOW (ref 30.0–100.0)

## 2020-08-01 NOTE — Progress Notes (Signed)
If you could let Hannah Hammond know overall her labs look really good. Her vitamin D is low which I would recommend an over the counter daily vitamin d3 2000 IU replacement daily with food. Her iron is also slightly low and for that I would recommend an over the counter iron replacement daily.

## 2020-08-04 ENCOUNTER — Other Ambulatory Visit: Payer: 59

## 2020-08-04 ENCOUNTER — Other Ambulatory Visit: Payer: Self-pay

## 2020-08-04 DIAGNOSIS — Z20822 Contact with and (suspected) exposure to covid-19: Secondary | ICD-10-CM

## 2020-08-05 LAB — NOVEL CORONAVIRUS, NAA: SARS-CoV-2, NAA: NOT DETECTED

## 2020-08-05 LAB — SARS-COV-2, NAA 2 DAY TAT

## 2020-09-01 ENCOUNTER — Other Ambulatory Visit: Payer: 59

## 2020-09-05 ENCOUNTER — Other Ambulatory Visit: Payer: 59 | Admitting: Orthotics

## 2020-09-10 ENCOUNTER — Ambulatory Visit: Payer: 59 | Admitting: Podiatry

## 2020-09-10 ENCOUNTER — Telehealth: Payer: Self-pay | Admitting: Podiatry

## 2020-09-10 NOTE — Telephone Encounter (Signed)
Patients mom called in regarding orthotics, patient still has unpaid balance so mom stated she would contact [employer] to see why balance is unpaid and would return call to reschedule appointments for follow up and orthotics

## 2020-09-11 ENCOUNTER — Encounter: Payer: Self-pay | Admitting: Podiatry

## 2020-09-11 ENCOUNTER — Ambulatory Visit: Payer: 59 | Admitting: Orthotics

## 2020-09-11 ENCOUNTER — Other Ambulatory Visit: Payer: Self-pay

## 2020-09-11 DIAGNOSIS — M25371 Other instability, right ankle: Secondary | ICD-10-CM

## 2020-09-11 DIAGNOSIS — Q666 Other congenital valgus deformities of feet: Secondary | ICD-10-CM

## 2020-09-11 NOTE — Progress Notes (Signed)
Patient picked up f/o and was pleased with fit, comfort, and function.  Worked well with footwear.  Told of rbeak in period and how to report any issues.  

## 2020-09-18 ENCOUNTER — Other Ambulatory Visit: Payer: 59 | Admitting: Orthotics

## 2020-11-03 ENCOUNTER — Encounter: Payer: 59 | Admitting: Family Medicine

## 2020-11-05 ENCOUNTER — Ambulatory Visit: Payer: 59 | Admitting: Family Medicine

## 2020-11-10 ENCOUNTER — Ambulatory Visit: Payer: 59 | Admitting: Family Medicine

## 2020-12-19 ENCOUNTER — Encounter: Payer: Self-pay | Admitting: Nurse Practitioner

## 2020-12-19 ENCOUNTER — Other Ambulatory Visit: Payer: Self-pay

## 2020-12-19 ENCOUNTER — Ambulatory Visit (INDEPENDENT_AMBULATORY_CARE_PROVIDER_SITE_OTHER): Payer: 59 | Admitting: Nurse Practitioner

## 2020-12-19 VITALS — BP 118/62 | HR 86 | Temp 97.7°F | Ht 67.0 in | Wt 260.6 lb

## 2020-12-19 DIAGNOSIS — Z833 Family history of diabetes mellitus: Secondary | ICD-10-CM | POA: Diagnosis not present

## 2020-12-19 DIAGNOSIS — E669 Obesity, unspecified: Secondary | ICD-10-CM | POA: Insufficient documentation

## 2020-12-19 DIAGNOSIS — D500 Iron deficiency anemia secondary to blood loss (chronic): Secondary | ICD-10-CM

## 2020-12-19 DIAGNOSIS — E559 Vitamin D deficiency, unspecified: Secondary | ICD-10-CM | POA: Diagnosis not present

## 2020-12-19 DIAGNOSIS — I10 Essential (primary) hypertension: Secondary | ICD-10-CM | POA: Diagnosis not present

## 2020-12-19 DIAGNOSIS — Z6841 Body Mass Index (BMI) 40.0 and over, adult: Secondary | ICD-10-CM

## 2020-12-19 MED ORDER — TRIAMTERENE-HCTZ 37.5-25 MG PO TABS: ORAL_TABLET | ORAL | 1 refills | Status: DC

## 2020-12-19 MED ORDER — VITAMIN D (ERGOCALCIFEROL) 1.25 MG (50000 UNIT) PO CAPS: 50000.0000 [IU] | ORAL_CAPSULE | ORAL | 0 refills | Status: DC

## 2020-12-19 NOTE — Assessment & Plan Note (Signed)
BP at goal with maxzide She has difficulty maintaining a DASH diet because she relies on school cafeteria. She does not engage in daily exercise due to her school schedule. BP Readings from Last 3 Encounters:  12/19/20 118/62  07/31/20 (!) 144/90  02/04/20 137/85   Reviewed lab results completed by previous pcp: cbc and cmp Advised about DASh diet and healthy snacks. Entered referral to nutritionist for additional guidance Sent maxzide refill F/up in 9months

## 2020-12-19 NOTE — Patient Instructions (Signed)
Thank you for choosing [facility]  Resume vitamin D (once a week) and prenatal multivitamin with iron (1tab daily) supplement.  You will be contacted to schedule appt with nutritionist.  Exercising to Stay Healthy To become healthy and stay healthy, it is recommended that you do moderate-intensity and vigorous-intensity exercise. You can tell that you are exercising at a moderate intensity if your heart starts beating faster and you start breathing faster but can still hold a conversation. You can tell that you are exercising at a vigorous intensity if you are breathing much harder and faster and cannot hold a conversation while exercising. Exercising regularly is important. It has many health benefits, such as:  Improving overall fitness, flexibility, and endurance.  Increasing bone density.  Helping with weight control.  Decreasing body fat.  Increasing muscle strength.  Reducing stress and tension.  Improving overall health. How often should I exercise? Choose an activity that you enjoy, and set realistic goals. Your health care provider can help you make an activity plan that works for you. Exercise regularly as told by your health care provider. This may include:  Doing strength training two times a week, such as: ? Lifting weights. ? Using resistance bands. ? Push-ups. ? Sit-ups. ? Yoga.  Doing a certain intensity of exercise for a given amount of time. Choose from these options: ? A total of 150 minutes of moderate-intensity exercise every week. ? A total of 75 minutes of vigorous-intensity exercise every week. ? A mix of moderate-intensity and vigorous-intensity exercise every week. Children, pregnant women, people who have not exercised regularly, people who are overweight, and older adults may need to talk with a health care provider about what activities are safe to do. If you have a medical condition, be sure to talk with your health care provider before  you start a new exercise program. What are some exercise ideas? Moderate-intensity exercise ideas include:  Walking 1 mile (1.6 km) in about 15 minutes.  Biking.  Hiking.  Golfing.  Dancing.  Water aerobics. Vigorous-intensity exercise ideas include:  Walking 4.5 miles (7.2 km) or more in about 1 hour.  Jogging or running 5 miles (8 km) in about 1 hour.  Biking 10 miles (16.1 km) or more in about 1 hour.  Lap swimming.  Roller-skating or in-line skating.  Cross-country skiing.  Vigorous competitive sports, such as football, basketball, and soccer.  Jumping rope.  Aerobic dancing.   What are some everyday activities that can help me to get exercise?  Yard work, such as: ? Pushing a Surveyor, mininglawn mower. ? Raking and bagging leaves.  Washing your car.  Pushing a stroller.  Shoveling snow.  Gardening.  Washing windows or floors. How can I be more active in my day-to-day activities?  Use stairs instead of an elevator.  Take a walk during your lunch break.  If you drive, park your car farther away from your work or school.  If you take public transportation, get off one stop early and walk the rest of the way.  Stand up or walk around during all of your indoor phone calls.  Get up, stretch, and walk around every 30 minutes throughout the day.  Enjoy exercise with a friend. Support to continue exercising will help you keep a regular routine of activity. What guidelines can I follow while exercising?  Before you start a new exercise program, talk with your health care provider.  Do not exercise so much that you hurt yourself, feel dizzy, or  get very short of breath.  Wear comfortable clothes and wear shoes with good support.  Drink plenty of water while you exercise to prevent dehydration or heat stroke.  Work out until your breathing and your heartbeat get faster. Where to find more information  [place] Department of Health and Human Services:  ThisPath.fi  Centers for Disease Control and Prevention (CDC): FootballExhibition.com.br Summary  Exercising regularly is important. It will improve your overall fitness, flexibility, and endurance.  Regular exercise also will improve your overall health. It can help you control your weight, reduce stress, and improve your bone density.  Do not exercise so much that you hurt yourself, feel dizzy, or get very short of breath.  Before you start a new exercise program, talk with your health care provider. This information is not intended to replace advice given to you by your health care provider. Make sure you discuss any questions you have with your health care provider. Document Revised: 07/22/2017 Document Reviewed: 06/30/2017 Elsevier Patient Education  2021 Elsevier Inc.  Calorie Counting for [employer] Calories are units of energy. Your body needs a certain number of calories from food to keep going throughout the day. When you eat or drink more calories than your body needs, your body stores the extra calories mostly as fat. When you eat or drink fewer calories than your body needs, your body burns fat to get the energy it needs. Calorie counting means keeping track of how many calories you eat and drink each day. Calorie counting can be helpful if you need to lose weight. If you eat fewer calories than your body needs, you should lose weight. Ask your health care provider what a healthy weight is for you. For calorie counting to work, you will need to eat the right number of calories each day to lose a healthy amount of weight per week. A dietitian can help you figure out how many calories you need in a day and will suggest ways to reach your calorie goal.  A healthy amount of weight to lose each week is usually 1-2 lb (0.5-0.9 kg). This usually means that your daily calorie intake should be reduced by 500-750 calories.  Eating 1,200-1,500 calories a day can help most women lose weight.  Eating  1,500-1,800 calories a day can help most men lose weight. What do I need to know about calorie counting? Work with your health care provider or dietitian to determine how many calories you should get each day. To meet your daily calorie goal, you will need to:  Find out how many calories are in each food that you would like to eat. Try to do this before you eat.  Decide how much of the food you plan to eat.  Keep a food log. Do this by writing down what you ate and how many calories it had. To successfully lose weight, it is important to balance calorie counting with a healthy lifestyle that includes regular activity. Where do I find calorie information? The number of calories in a food can be found on a Nutrition Facts label. If a food does not have a Nutrition Facts label, try to look up the calories online or ask your dietitian for help. Remember that calories are listed per serving. If you choose to have more than one serving of a food, you will have to multiply the calories per serving by the number of servings you plan to eat. For example, the label on a package of bread might  say that a serving size is 1 slice and that there are 90 calories in a serving. If you eat 1 slice, you will have eaten 90 calories. If you eat 2 slices, you will have eaten 180 calories.   How do I keep a food log? After each time that you eat, record the following in your food log as soon as possible:  What you ate. Be sure to include toppings, sauces, and other extras on the food.  How much you ate. This can be measured in cups, ounces, or number of items.  How many calories were in each food and drink.  The total number of calories in the food you ate. Keep your food log near you, such as in a pocket-sized notebook or on an app or website on your mobile phone. Some programs will calculate calories for you and show you how many calories you have left to meet your daily goal. What are some portion-control  tips?  Know how many calories are in a serving. This will help you know how many servings you can have of a certain food.  Use a measuring cup to measure serving sizes. You could also try weighing out portions on a kitchen scale. With time, you will be able to estimate serving sizes for some foods.  Take time to put servings of different foods on your favorite plates or in your favorite bowls and cups so you know what a serving looks like.  Try not to eat straight from a food's packaging, such as from a bag or box. Eating straight from the package makes it hard to see how much you are eating and can lead to overeating. Put the amount you would like to eat in a cup or on a plate to make sure you are eating the right portion.  Use smaller plates, glasses, and bowls for smaller portions and to prevent overeating.  Try not to multitask. For example, avoid watching TV or using your computer while eating. If it is time to eat, sit down at a table and enjoy your food. This will help you recognize when you are full. It will also help you be more mindful of what and how much you are eating. What are tips for following this plan? Reading food labels  Check the calorie count compared with the serving size. The serving size may be smaller than what you are used to eating.  Check the source of the calories. Try to choose foods that are high in protein, fiber, and vitamins, and low in saturated fat, trans fat, and sodium. Shopping  Read nutrition labels while you shop. This will help you make healthy decisions about which foods to buy.  Pay attention to nutrition labels for low-fat or fat-free foods. These foods sometimes have the same number of calories or more calories than the full-fat versions. They also often have added sugar, starch, or salt to make up for flavor that was removed with the fat.  Make a grocery list of lower-calorie foods and stick to it. Cooking  Try to cook your favorite foods in  a healthier way. For example, try baking instead of frying.  Use low-fat dairy products. Meal planning  Use more fruits and vegetables. One-half of your plate should be fruits and vegetables.  Include lean proteins, such as chicken, [place], and fish. Lifestyle Each week, aim to do one of the following:  150 minutes of moderate exercise, such as walking.  75 minutes of vigorous exercise,  such as running. General information  Know how many calories are in the foods you eat most often. This will help you calculate calorie counts faster.  Find a way of tracking calories that works for you. Get creative. Try different apps or programs if writing down calories does not work for you. What foods should I eat?  Eat nutritious foods. It is better to have a nutritious, high-calorie food, such as an avocado, than a food with few nutrients, such as a bag of potato chips.  Use your calories on foods and drinks that will fill you up and will not leave you hungry soon after eating. ? Examples of foods that fill you up are nuts and nut butters, vegetables, lean proteins, and high-fiber foods such as whole grains. High-fiber foods are foods with more than 5 g of fiber per serving.  Pay attention to calories in drinks. Low-calorie drinks include water and unsweetened drinks. The items listed above may not be a complete list of foods and beverages you can eat. Contact a dietitian for more information.   What foods should I limit? Limit foods or drinks that are not good sources of vitamins, minerals, or protein or that are high in unhealthy fats. These include:  Candy.  Other sweets.  Sodas, specialty coffee drinks, alcohol, and juice. The items listed above may not be a complete list of foods and beverages you should avoid. Contact a dietitian for more information. How do I count calories when eating out?  Pay attention to portions. Often, portions are much larger when eating out. Try these tips  to keep portions smaller: ? Consider sharing a meal instead of getting your own. ? If you get your own meal, eat only half of it. Before you start eating, ask for a container and put half of your meal into it. ? When available, consider ordering smaller portions from the menu instead of full portions.  Pay attention to your food and drink choices. Knowing the way food is cooked and what is included with the meal can help you eat fewer calories. ? If calories are listed on the menu, choose the lower-calorie options. ? Choose dishes that include vegetables, fruits, whole grains, low-fat dairy products, and lean proteins. ? Choose items that are boiled, broiled, grilled, or steamed. Avoid items that are buttered, battered, fried, or served with cream sauce. Items labeled as crispy are usually fried, unless stated otherwise. ? Choose water, low-fat milk, unsweetened iced tea, or other drinks without added sugar. If you want an alcoholic beverage, choose a lower-calorie option, such as a glass of wine or light beer. ? Ask for dressings, sauces, and syrups on the side. These are usually high in calories, so you should limit the amount you eat. ? If you want a salad, choose a garden salad and ask for grilled meats. Avoid extra toppings such as bacon, cheese, or fried items. Ask for the dressing on the side, or ask for olive oil and vinegar or lemon to use as dressing.  Estimate how many servings of a food you are given. Knowing serving sizes will help you be aware of how much food you are eating at restaurants. Where to find more information  Centers for Disease Control and Prevention: FootballExhibition.com.br  [place]. Department of Agriculture: WrestlingReporter.dk Summary  Calorie counting means keeping track of how many calories you eat and drink each day. If you eat fewer calories than your body needs, you should lose weight.  A healthy amount  of weight to lose per week is usually 1-2 lb (0.5-0.9 kg). This usually means  reducing your daily calorie intake by 500-750 calories.  The number of calories in a food can be found on a Nutrition Facts label. If a food does not have a Nutrition Facts label, try to look up the calories online or ask your dietitian for help.  Use smaller plates, glasses, and bowls for smaller portions and to prevent overeating.  Use your calories on foods and drinks that will fill you up and not leave you hungry shortly after a meal. This information is not intended to replace advice given to you by your health care provider. Make sure you discuss any questions you have with your health care provider. Document Revised: 09/20/2019 Document Reviewed: 09/20/2019 Elsevier Patient Education  2021 [employer].

## 2020-12-19 NOTE — Assessment & Plan Note (Addendum)
Does not want to start OCP since she is not sexually active. Does not take multivitamin or iron supplement. Has monthly cycles, last 6-7days, no clots No fatigue, no SOB, CP, pica, or paresthesia.  Advised to start prenatal multivitamin with iron 1tab daily

## 2020-12-19 NOTE — Assessment & Plan Note (Signed)
Last hgbA1c of 5.3% 07/2020. No polyuria, polyphagia, polydipsia, weight fluctuation, skin changes, fatigue, or change in vision.  Repeat hgbA1c every 30months Advise to maintain DASH diet and daily exercise to promote weight loss, and prevent onset of diabetes

## 2020-12-19 NOTE — Assessment & Plan Note (Signed)
Advised about need for weight loss through regular exercise and DASH diet. Entered referral to nutritionist F/up in 60months

## 2020-12-19 NOTE — Assessment & Plan Note (Signed)
Start high dose vitamin D New rx sent Repeat in 6-8weeks

## 2020-12-19 NOTE — Progress Notes (Signed)
Subjective:  Patient ID: Hannah Hammond, female    DOB: March 08, 2001  Age: 20 y.o. MRN: [id number]  CC: Establish Care (New patient/Would like to discuss HTN, Iron, and diabetes. 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 She lives in a dorm with roommates, but also goes home to her family on weekends. She is here to establish care. She is transferring from Dr. Leretha Pol. She was last seen 07/2020. Today she wants to discuss need for repeat HgbA1c due to her FHx of DM (mother and father), HTN control and medication refill  HTN (hypertension), benign BP at goal with maxzide She has difficulty maintaining a DASH diet because she relies on school cafeteria. She does not engage in daily exercise due to her school schedule. BP Readings from Last 3 Encounters:  12/19/20 118/62  07/31/20 (!) 144/90  02/04/20 137/85   Reviewed lab results completed by previous pcp: cbc and cmp Advised about DASh diet and healthy snacks. Entered referral to nutritionist for additional guidance Sent maxzide refill F/up in 58months  Obesity Advised about need for weight loss through regular exercise and DASH diet. Entered referral to nutritionist F/up in 75months  Iron deficiency anemia due to chronic blood loss Does not want to start OCP since she is not sexually active. Does not take multivitamin or iron supplement. Has monthly cycles, last 6-7days, no clots No fatigue, no SOB, CP, pica, or paresthesia.  Advised to start prenatal multivitamin with iron 1tab daily   Family history of diabetes mellitus (DM) Last hgbA1c of 5.3% 07/2020. No polyuria, polyphagia, polydipsia, weight fluctuation, skin changes, fatigue, or change in vision.  Repeat hgbA1c every 42months Advise to maintain DASH diet and daily exercise to promote weight loss, and prevent onset of diabetes  Vitamin D deficiency Start high dose vitamin D New rx sent Repeat in 6-8weeks  Reviewed past  Medical, Social and Family history today.  Outpatient Medications Prior to Visit  Medication Sig Dispense Refill  . buPROPion (WELLBUTRIN) 75 MG tablet Take 75 mg by mouth 2 (two) times daily.    Marland Kitchen nystatin cream (MYCOSTATIN) Apply 1 application topically 2 (two) times daily. 30 g 5  . triamterene-hydrochlorothiazide (MAXZIDE-25) 37.5-25 MG tablet Take 1/2 (one-half) tablet by mouth once daily 90 tablet 3  . norgestimate-ethinyl estradiol (ORTHO-CYCLEN) 0.25-35 MG-MCG tablet Take 1 tablet by mouth daily. (Patient not taking: No sig reported) 84 tablet 3  . Multiple Vitamin (MULTIVITAMIN) tablet Take 1 tablet by mouth daily. (Patient not taking: No sig reported)     Facility-Administered Medications Prior to Visit  Medication Dose Route Frequency Provider Last Rate Last Admin  . ketoconazole (NIZORAL) 2 % shampoo   Topical Once per day on Mon Thu Santiago Lago, Irma M, MD        ROS See HPI  Objective:  BP 118/62 (BP Location: Left Arm, Patient Position: Sitting, Cuff Size: Large)   Pulse 86   Temp 97.7 F (36.5 C) (Temporal)   Ht 5\' 7"  (1.702 m)   Wt 260 lb 9.6 oz (118.2 kg)   SpO2 100%   BMI 40.82 kg/m   Physical Exam Vitals reviewed.  Constitutional:      Appearance: She is obese.  Neck:     Thyroid: No thyroid mass, thyromegaly or thyroid tenderness.  Cardiovascular:     Rate and Rhythm: Normal rate and regular rhythm.     Pulses: Normal pulses.     Heart sounds: Normal heart sounds.  Pulmonary:     Effort: Pulmonary effort is normal.     Breath sounds: Normal breath sounds.  Musculoskeletal:     Cervical back: Normal range of motion and neck supple.     Right lower leg: No edema.     Left lower leg: No edema.  Lymphadenopathy:     Cervical: No cervical adenopathy.  Neurological:     Mental Status: She is alert and oriented to person, place, and time.  Psychiatric:        Mood and Affect: Mood normal.        Behavior: Behavior normal.        Thought Content:  Thought content normal.     Assessment & Plan:  This visit occurred during the SARS-CoV-2 public health emergency.  Safety protocols were in place, including screening questions prior to the visit, additional usage of staff PPE, and extensive cleaning of exam room while observing appropriate contact time as indicated for disinfecting solutions.   Karma was seen today for establish care.  Diagnoses and all orders for this visit:  HTN (hypertension), benign -     Amb ref to Medical Nutrition Therapy-MNT -     triamterene-hydrochlorothiazide (MAXZIDE-25) 37.5-25 MG tablet; Take 1/2 (one-half) tablet by mouth once daily  Vitamin D deficiency -     Vitamin D, Ergocalciferol, (DRISDOL) 1.25 MG (50000 UNIT) CAPS capsule; Take 1 capsule (50,000 Units total) by mouth every 7 (seven) days.  Class 3 severe obesity due to excess calories with serious comorbidity and body mass index (BMI) of 40.0 to 44.9 in adult (HCC) -     Amb ref to Medical Nutrition Therapy-MNT  Family history of diabetes mellitus (DM) -     Amb ref to Medical Nutrition Therapy-MNT  Iron deficiency anemia due to chronic blood loss -     Amb ref to Medical Nutrition Therapy-MNT    Problem List Items Addressed This Visit      Cardiovascular and Mediastinum   HTN (hypertension), benign - Primary    BP at goal with maxzide She has difficulty maintaining a DASH diet because she relies on school cafeteria. She does not engage in daily exercise due to her school schedule. BP Readings from Last 3 Encounters:  12/19/20 118/62  07/31/20 (!) 144/90  02/04/20 137/85   Reviewed lab results completed by previous pcp: cbc and cmp Advised about DASh diet and healthy snacks. Entered referral to nutritionist for additional guidance Sent maxzide refill F/up in 49months      Relevant Medications   triamterene-hydrochlorothiazide (MAXZIDE-25) 37.5-25 MG tablet   Other Relevant Orders   Amb ref to Medical Nutrition Therapy-MNT      Other   Family history of diabetes mellitus (DM)    Last hgbA1c of 5.3% 07/2020. No polyuria, polyphagia, polydipsia, weight fluctuation, skin changes, fatigue, or change in vision.  Repeat hgbA1c every 64months Advise to maintain DASH diet and daily exercise to promote weight loss, and prevent onset of diabetes      Relevant Orders   Amb ref to Medical Nutrition Therapy-MNT   Iron deficiency anemia due to chronic blood loss    Does not want to start OCP since she is not sexually active. Does not take multivitamin or iron supplement. Has monthly cycles, last 6-7days, no clots No fatigue, no SOB, CP, pica, or paresthesia.  Advised to start prenatal multivitamin with iron 1tab daily       Relevant Orders   Amb ref to Medical Nutrition Therapy-MNT  Obesity    Advised about need for weight loss through regular exercise and DASH diet. Entered referral to nutritionist F/up in 83months      Relevant Orders   Amb ref to Medical Nutrition Therapy-MNT   Vitamin D deficiency    Start high dose vitamin D New rx sent Repeat in 6-8weeks      Relevant Medications   Vitamin D, Ergocalciferol, (DRISDOL) 1.25 MG (50000 UNIT) CAPS capsule      Follow-up: Return in about 2 months (around 02/18/2021) for CPE (fasting).  Alysia Penna, NP

## 2021-02-13 ENCOUNTER — Ambulatory Visit (INDEPENDENT_AMBULATORY_CARE_PROVIDER_SITE_OTHER): Payer: 59 | Admitting: Nurse Practitioner

## 2021-02-13 ENCOUNTER — Encounter: Payer: Self-pay | Admitting: Nurse Practitioner

## 2021-02-13 ENCOUNTER — Other Ambulatory Visit: Payer: Self-pay

## 2021-02-13 VITALS — BP 124/90 | HR 74 | Temp 98.0°F | Ht 67.25 in | Wt 261.8 lb

## 2021-02-13 DIAGNOSIS — E559 Vitamin D deficiency, unspecified: Secondary | ICD-10-CM | POA: Diagnosis not present

## 2021-02-13 DIAGNOSIS — Z13220 Encounter for screening for lipoid disorders: Secondary | ICD-10-CM

## 2021-02-13 DIAGNOSIS — Z0001 Encounter for general adult medical examination with abnormal findings: Secondary | ICD-10-CM

## 2021-02-13 DIAGNOSIS — Z136 Encounter for screening for cardiovascular disorders: Secondary | ICD-10-CM

## 2021-02-13 DIAGNOSIS — D500 Iron deficiency anemia secondary to blood loss (chronic): Secondary | ICD-10-CM

## 2021-02-13 DIAGNOSIS — I10 Essential (primary) hypertension: Secondary | ICD-10-CM | POA: Diagnosis not present

## 2021-02-13 LAB — COMPREHENSIVE METABOLIC PANEL
ALT: 15 U/L (ref 0–35)
AST: 19 U/L (ref 0–37)
Albumin: 4.2 g/dL (ref 3.5–5.2)
Alkaline Phosphatase: 89 U/L (ref 47–119)
BUN: 8 mg/dL (ref 6–23)
CO2: 27 mEq/L (ref 19–32)
Calcium: 9.4 mg/dL (ref 8.4–10.5)
Chloride: 103 mEq/L (ref 96–112)
Creatinine, Ser: 0.84 mg/dL (ref 0.40–1.20)
GFR: 100.69 mL/min (ref >60.00)
Glucose, Bld: 81 mg/dL (ref 70–99)
Potassium: 4 mEq/L (ref 3.5–5.1)
Sodium: 138 mEq/L (ref 135–145)
Total Bilirubin: 0.6 mg/dL (ref 0.2–1.2)
Total Protein: 6.9 g/dL (ref 6.0–8.3)

## 2021-02-13 LAB — TSH: TSH: 1.3 u[IU]/mL (ref 0.40–5.00)

## 2021-02-13 LAB — CBC WITH DIFFERENTIAL/PLATELET
Basophils Absolute: 0.1 10*3/uL (ref 0.0–0.1)
Basophils Relative: 0.6 % (ref 0.0–3.0)
Eosinophils Absolute: 0.1 10*3/uL (ref 0.0–0.7)
Eosinophils Relative: 0.9 % (ref 0.0–5.0)
HCT: 38.4 % (ref 36.0–49.0)
Hemoglobin: 12.3 g/dL (ref 12.0–16.0)
Lymphocytes Relative: 32.4 % (ref 24.0–48.0)
Lymphs Abs: 2.5 10*3/uL (ref 0.7–4.0)
MCHC: 32 g/dL (ref 31.0–37.0)
MCV: 81.5 fl (ref 78.0–98.0)
Monocytes Absolute: 0.5 10*3/uL (ref 0.1–1.0)
Monocytes Relative: 5.8 % (ref 3.0–12.0)
Neutro Abs: 4.7 10*3/uL (ref 1.4–7.7)
Neutrophils Relative %: 60.3 % (ref 43.0–71.0)
Platelets: 240 10*3/uL (ref 150.0–575.0)
RBC: 4.71 Mil/uL (ref 3.80–5.70)
RDW: 14.5 % (ref 11.4–15.5)
WBC: 7.8 10*3/uL (ref 4.5–13.5)

## 2021-02-13 LAB — LIPID PANEL
Cholesterol: 130 mg/dL (ref 0–200)
HDL: 42.6 mg/dL (ref >39.00)
LDL Cholesterol: 65 mg/dL (ref 0–99)
NonHDL: 87.61
Total CHOL/HDL Ratio: 3
Triglycerides: 113 mg/dL (ref 0.0–149.0)
VLDL: 22.6 mg/dL (ref 0.0–40.0)

## 2021-02-13 MED ORDER — TRIAMTERENE-HCTZ 37.5-25 MG PO TABS: ORAL_TABLET | ORAL | 1 refills | Status: DC

## 2021-02-13 NOTE — Patient Instructions (Signed)
Go to lab for blood draw  Maintain DASH diet and start daily exercise.  Preventive Care 65-20 Years Old, Female Preventive care refers to lifestyle choices and visits with your health care provider that can promote health and wellness. At this stage in your life, you may start seeing a primary care physician instead of a pediatrician. It is important to take responsibility for your health and well-being. Preventive care for young adults includes: A yearly physical exam. This is also called an annual wellness visit. Regular dental and eye exams. Immunizations. Screening for certain conditions. Healthy lifestyle choices, such as: Eating a healthy diet. Getting regular exercise. Not using drugs or products that contain nicotine and tobacco. Limiting alcohol use. What can I expect for my preventive care visit? Physical exam Your health care provider may check your: Height and weight. These may be used to calculate your BMI (body mass index). BMI is a measurement that tells if you are at a healthy weight. Heart rate and blood pressure. Body temperature. Skin for abnormal spots. Counseling Your health care provider may ask you questions about your: Past medical problems. Family's medical history. Alcohol, tobacco, and drug use. Home life and relationship well-being. Access to firearms. Emotional well-being. Diet, exercise, and sleep habits. Sexual activity and sexual health. Method of birth control. Menstrual cycle. Pregnancy history. What immunizations do I need?  Vaccines are usually given at various ages, according to a schedule. Your health care provider will recommend vaccines for you based on your age, medicalhistory, and lifestyle or other factors, such as travel or where you work. What tests do I need? Blood tests Lipid and cholesterol levels. These may be checked every 5 years starting at age 20. Hepatitis C test. Hepatitis B test. Screening Pelvic exam and Pap test.  This may be done every 3 years starting at age 20. STD (sexually transmitted disease) testing, if you are at risk. BRCA-related cancer screening. This may be done if you have a family history of breast, ovarian, tubal, or peritoneal cancers. Other tests Tuberculosis skin test. Vision and hearing tests. Skin exam. Breast exam. Talk with your health care provider about your test results, treatment options,and if necessary, the need for more tests. Follow these instructions at home: Eating and drinking  Eat a healthy diet that includes fresh fruits and vegetables, whole grains, lean protein, and low-fat dairy products. Drink enough fluid to keep your urine pale yellow. Do not drink alcohol if: Your health care provider tells you not to drink. You are pregnant, may be pregnant, or are planning to become pregnant. You are under the legal drinking age. In the [place] the legal drinking age is 20. If you drink alcohol: Limit how much you use to 0-1 drink a day. Be aware of how much alcohol is in your drink. In the [place] one drink equals one 12 oz bottle of beer (355 mL), one 5 oz glass of wine (148 mL), or one 1 oz glass of hard liquor (44 mL).  Lifestyle Take daily care of your teeth and gums. Brush your teeth every morning and night with fluoride toothpaste. Floss one time each day. Stay active. Exercise for at least 30 minutes 5 or more days of the week. Do not use any products that contain nicotine or tobacco, such as cigarettes, e-cigarettes, and chewing tobacco. If you need help quitting, ask your health care provider. Do not use drugs. If you are sexually active, practice safe sex. Use a condom or other form of protection  to prevent STIs (sexually transmitted infections). If you do not wish to become pregnant, use a form of birth control. If you plan to become pregnant, see your health care provider for a prepregnancy visit. Find healthy ways to cope with stress, such as: Meditation,  yoga, or listening to music. Journaling. Talking to a trusted person. Spending time with friends and family. Safety Always wear your seat belt while driving or riding in a vehicle. Do not drive: If you have been drinking alcohol. Do not ride with someone who has been drinking. When you are tired or distracted. While texting. Wear a helmet and other protective equipment during sports activities. If you have firearms in your house, make sure you follow all gun safety procedures. Seek help if you have been bullied, physically abused, or sexually abused. Use the Internet responsibly to avoid dangers, such as online bullying and online sex predators. What's next? Go to your health care provider once a year for an annual wellness visit. Ask your health care provider how often you should have your eyes and teeth checked. Stay up to date on all vaccines. This information is not intended to replace advice given to you by your health care provider. Make sure you discuss any questions you have with your healthcare provider. Document Revised: 04/06/2020 Document Reviewed: 08/03/2018 Elsevier Patient Education  2022 [employer].

## 2021-02-13 NOTE — Progress Notes (Signed)
Subjective:    Patient ID: Hannah Hammond, female    DOB: [birth year], 20 y.o.   MRN: [id number]  Patient presents today for CPE and eval of chronic conditions  HPI HTN (hypertension), benign BP at goal BP Readings from Last 3 Encounters:  02/13/21 124/90  12/19/20 118/62  07/31/20 (!) 144/90   Repeat CMP Med refill sent  Vision:not needed per patient Dental: up to date Diet:regular Exercise:none Weight:  Wt Readings from Last 3 Encounters:  02/13/21 261 lb 12.8 oz (118.8 kg) (>99 %, Z= 2.59)*  12/19/20 260 lb 9.6 oz (118.2 kg) (>99 %, Z= 2.57)*  07/31/20 251 lb (113.9 kg) (>99 %, Z= 2.48)*   * Growth percentiles are based on CDC (Girls, 2-20 Years) data.    Sexual History (orientation,birth control, marital status, STD):declines need for STD screen  Depression/Suicide: Depression screen [facility] 2/9 07/31/2020 09/14/2019 05/08/2019 09/05/2018 05/20/2018 01/02/2018  Decreased Interest 0 0 0 0 0 0  Down, Depressed, Hopeless 0 0 0 0 0 0  PHQ - 2 Score 0 0 0 0 0 0   Immunizations: (TDAP, Hep C screen, Pneumovax, Influenza, zoster)  Health Maintenance  Topic Date Due   COVID-19 Vaccine (3 - Pfizer risk series) 01/22/2020   Pneumococcal Vaccination (1 - PCV) 02/13/2022*   HPV Vaccine (1 - 2-dose series) 02/13/2022*   Hepatitis C Screening: USPSTF Recommendation to screen - Ages 18-79 yo.  02/13/2022*   HIV Screening  02/13/2022*   Flu Shot  03/23/2021   Tetanus Vaccine  04/06/2023  *Topic was postponed. The date shown is not the original due date.   Fall Risk: Fall Risk  07/31/2020 09/14/2019 05/08/2019 09/05/2018 01/02/2018  Falls in the past year? 0 0 0 1 No  Number falls in past yr: 0 - 0 0 -  Injury with Fall? 0 - 0 1 -  Comment - - - at school- right ankle -  Follow up Falls evaluation completed Falls evaluation completed - - -   Medications and allergies reviewed with patient and updated if appropriate.  Patient Active Problem List   Diagnosis Date Noted   HTN  (hypertension), benign 12/19/2020   Obesity 12/19/2020   Family history of diabetes mellitus (DM) 12/19/2020   Iron deficiency anemia due to chronic blood loss 12/19/2020   Vitamin D deficiency 12/19/2020    Current Outpatient Medications on File Prior to Visit  Medication Sig Dispense Refill   buPROPion (WELLBUTRIN) 75 MG tablet Take 75 mg by mouth 2 (two) times daily.     nystatin cream (MYCOSTATIN) Apply 1 application topically 2 (two) times daily. 30 g 5   triamterene-hydrochlorothiazide (MAXZIDE-25) 37.5-25 MG tablet Take 1/2 (one-half) tablet by mouth once daily 45 tablet 1   Vitamin D, Ergocalciferol, (DRISDOL) 1.25 MG (50000 UNIT) CAPS capsule Take 1 capsule (50,000 Units total) by mouth every 7 (seven) days. 8 capsule 0   Current Facility-Administered Medications on File Prior to Visit  Medication Dose Route Frequency Provider Last Rate Last Admin   ketoconazole (NIZORAL) 2 % shampoo   Topical Once per day on Mon Thu Lezlie Lye, Meda Coffee, MD        Past Medical History:  Diagnosis Date   Hypertension     History reviewed. No pertinent surgical history.  Social History   Socioeconomic History   Marital status: Single    Spouse name: Not on file   Number of children: Not on file   Years of education: Not on file  Highest education level: Not on file  Occupational History    Comment: Student at [employer]  Tobacco Use   Smoking status: Never   Smokeless tobacco: Never  Vaping Use   Vaping Use: Never used  Substance and Sexual Activity   Alcohol use: No   Drug use: Never   Sexual activity: Not Currently    Birth control/protection: None  Other Topics Concern   Not on file  Social History Narrative   Not on file   Social Determinants of Health   Financial Resource Strain: Not on file  Food Insecurity: Not on file  Transportation Needs: Not on file  Physical Activity: Not on file  Stress: Not on file  Social Connections: Not on file    Family History   Problem Relation Age of Onset   Hypertension Mother    Vitamin D deficiency Mother    Diabetes Mellitus II Father    Hyperlipidemia Father    Diabetes Father        Review of Systems  Constitutional:  Negative for fever, malaise/fatigue and weight loss.  HENT:  Negative for congestion and sore throat.   Eyes:        Negative for visual changes  Respiratory:  Negative for cough and shortness of breath.   Cardiovascular:  Negative for chest pain, palpitations and leg swelling.  Gastrointestinal:  Negative for blood in stool, constipation, diarrhea and heartburn.  Genitourinary:  Negative for dysuria, frequency and urgency.  Musculoskeletal:  Negative for falls, joint pain and myalgias.  Skin:  Negative for rash.  Neurological:  Negative for dizziness, sensory change and headaches.  Endo/Heme/Allergies:  Does not bruise/bleed easily.  Psychiatric/Behavioral:  Negative for depression, substance abuse and suicidal ideas. The patient is not nervous/anxious.    Objective:   Vitals:   02/13/21 1027  BP: 124/90  Pulse: 74  Temp: 98 F (36.7 C)  SpO2: 99%    Body mass index is 40.7 kg/m.   Physical Examination:  Physical Exam Constitutional:      General: She is not in acute distress. HENT:     Right Ear: Tympanic membrane, ear canal and external ear normal.     Left Ear: Tympanic membrane, ear canal and external ear normal.  Cardiovascular:     Rate and Rhythm: Normal rate and regular rhythm.     Pulses: Normal pulses.     Heart sounds: Normal heart sounds.  Pulmonary:     Effort: Pulmonary effort is normal. No respiratory distress.     Breath sounds: Normal breath sounds.  Abdominal:     General: There is no distension.     Palpations: Abdomen is soft.  Genitourinary:    Comments: Declined breast exam Musculoskeletal:        General: Normal range of motion.     Cervical back: Normal range of motion and neck supple.  Lymphadenopathy:     Cervical: No cervical  adenopathy.  Skin:    General: Skin is warm and dry.  Neurological:     Mental Status: She is alert and oriented to person, place, and time.  Psychiatric:        Mood and Affect: Mood normal.        Behavior: Behavior normal.        Thought Content: Thought content normal.    ASSESSMENT and PLAN: This visit occurred during the SARS-CoV-2 public health emergency.  Safety protocols were in place, including screening questions prior to the visit, additional usage of staff  PPE, and extensive cleaning of exam room while observing appropriate contact time as indicated for disinfecting solutions.   Hannah Hammond was seen today for annual exam.  Diagnoses and all orders for this visit:  Encounter for preventative adult health care exam with abnormal findings -     Lipid panel -     Comprehensive metabolic panel -     TSH  HTN (hypertension), benign  Vitamin D deficiency -     Vitamin D 1,25 dihydroxy  Iron deficiency anemia due to chronic blood loss -     CBC with Differential/Platelet -     Iron, TIBC and Ferritin Panel  Encounter for lipid screening for cardiovascular disease -     Lipid panel     Problem List Items Addressed This Visit       Cardiovascular and Mediastinum   HTN (hypertension), benign    BP at goal BP Readings from Last 3 Encounters:  02/13/21 124/90  12/19/20 118/62  07/31/20 (!) 144/90   Repeat CMP Med refill sent         Other   Iron deficiency anemia due to chronic blood loss   Relevant Orders   CBC with Differential/Platelet   Iron, TIBC and Ferritin Panel   Vitamin D deficiency   Relevant Orders   Vitamin D 1,25 dihydroxy   Other Visit Diagnoses     Encounter for preventative adult health care exam with abnormal findings    -  Primary   Relevant Orders   Lipid panel   Comprehensive metabolic panel   TSH   Encounter for lipid screening for cardiovascular disease       Relevant Orders   Lipid panel       Follow up: Return in about 6  months (around 08/15/2021) for HTN and vitamin D ( ).  Alysia Penna, NP

## 2021-02-13 NOTE — Assessment & Plan Note (Signed)
BP at goal BP Readings from Last 3 Encounters:  02/13/21 124/90  12/19/20 118/62  07/31/20 (!) 144/90   Repeat CMP Med refill sent

## 2021-02-17 ENCOUNTER — Telehealth: Payer: Self-pay | Admitting: Nurse Practitioner

## 2021-02-17 NOTE — Telephone Encounter (Signed)
Patients mom is calling to get a refill on patients Nystatin Cream, Maxide and birth control. If approved, please send to pharmacy and call mom at [phone] to let her know that it has been sent in.

## 2021-02-18 ENCOUNTER — Telehealth: Payer: Self-pay

## 2021-02-18 LAB — IRON,TIBC AND FERRITIN PANEL
%SAT: 19 % (calc) (ref 15–45)
Ferritin: 26 ng/mL (ref 16–154)
Iron: 65 ug/dL (ref 27–164)
TIBC: 334 mcg/dL (calc) (ref 271–448)

## 2021-02-18 LAB — VITAMIN D 1,25 DIHYDROXY
Vitamin D 1, 25 (OH)2 Total: 48 pg/mL (ref 18–72)
Vitamin D2 1, 25 (OH)2: <8 pg/mL
Vitamin D3 1, 25 (OH)2: 48 pg/mL

## 2021-02-18 NOTE — Telephone Encounter (Signed)
VM to rtn call regarding refill request received from pharmacy.  Needs a office visit to discuss them since Claris Gower is no here.  Dm/cma

## 2021-02-24 ENCOUNTER — Telehealth: Payer: Self-pay

## 2021-02-24 NOTE — Telephone Encounter (Signed)
Duplicate request, this has been sent to provider, closing this one out.

## 2021-02-24 NOTE — Telephone Encounter (Signed)
Patient was recently seen on 02/13/21 and states they forgot to request refills for the following medications and would like to know if they could be sent in. Pt states she has the Maxide but does not have her Wellbutrin 75 mg, Nystatin Cream, or birth control Ortho Cycl[facility]. Please advise.

## 2021-02-24 NOTE — Telephone Encounter (Signed)
Pts mother, Alsie Younes, called in wanting to know if pts nystatin cream can be called in. She also stated, pts rx for Welbutrin was denied. She would like to know why Welbutrin was denied.  Pharmacy is [facility] on [address]  Thank you

## 2021-02-25 NOTE — Telephone Encounter (Signed)
Ok to refill please. I don't see OCP on her active med list?

## 2021-02-26 MED ORDER — NYSTATIN 100000 UNIT/GM EX CREA: 1.0000 "application " | TOPICAL_CREAM | Freq: Two times a day (BID) | CUTANEOUS | 5 refills | Status: DC

## 2021-02-26 NOTE — Telephone Encounter (Signed)
Pt informed to come in to discuss birth control

## 2021-03-04 NOTE — Telephone Encounter (Signed)
error 

## 2021-03-10 ENCOUNTER — Other Ambulatory Visit: Payer: Self-pay

## 2021-03-10 MED ORDER — BUPROPION HCL 75 MG PO TABS: 75.0000 mg | ORAL_TABLET | Freq: Two times a day (BID) | ORAL | 0 refills | Status: DC

## 2021-05-23 ENCOUNTER — Other Ambulatory Visit: Payer: Self-pay | Admitting: Nurse Practitioner

## 2021-05-23 DIAGNOSIS — E559 Vitamin D deficiency, unspecified: Secondary | ICD-10-CM

## 2021-06-04 ENCOUNTER — Telehealth: Payer: Self-pay | Admitting: Nurse Practitioner

## 2021-06-04 DIAGNOSIS — I10 Essential (primary) hypertension: Secondary | ICD-10-CM

## 2021-06-04 NOTE — Telephone Encounter (Signed)
Caller Name: Gunnar Fusi, mother Call back phone #: [phone]  MEDICATION(S): pt needing refills on 3 meds  buPROPion (WELLBUTRIN) 75 MG tablet nystatin cream (MYCOSTATIN) triamterene-hydrochlorothiazide (MAXZIDE-25) 37.5-25 MG tablet  Has the patient contacted their pharmacy? No. No refills left Next OV 08/19/2021 Last OV 02/13/2021  Preferred Pharmacy: [id number] - [place], [place] - [address]

## 2021-06-05 MED ORDER — BUPROPION HCL 75 MG PO TABS: 75.0000 mg | ORAL_TABLET | Freq: Two times a day (BID) | ORAL | 0 refills | Status: DC

## 2021-06-05 MED ORDER — NYSTATIN 100000 UNIT/GM EX CREA: 1.0000 "application " | TOPICAL_CREAM | Freq: Two times a day (BID) | CUTANEOUS | 0 refills | Status: DC

## 2021-06-05 MED ORDER — TRIAMTERENE-HCTZ 37.5-25 MG PO TABS: ORAL_TABLET | ORAL | 1 refills | Status: DC

## 2021-06-05 NOTE — Telephone Encounter (Signed)
Chart supports Rx Next OV 08/19/2021 Last OV 02/13/2021

## 2021-08-13 ENCOUNTER — Ambulatory Visit: Payer: 59 | Admitting: Nurse Practitioner

## 2021-08-13 ENCOUNTER — Telehealth: Payer: Self-pay | Admitting: Nurse Practitioner

## 2021-08-19 ENCOUNTER — Ambulatory Visit: Payer: 59 | Admitting: Nurse Practitioner

## 2021-08-20 NOTE — Telephone Encounter (Signed)
LVM for patient to return call with what medications are needed

## 2021-09-13 ENCOUNTER — Other Ambulatory Visit: Payer: Self-pay | Admitting: Nurse Practitioner

## 2021-09-16 NOTE — Telephone Encounter (Signed)
Chart supports Rx Last seen 02/13/21 Next OV 09/22/21

## 2021-09-22 ENCOUNTER — Ambulatory Visit: Payer: 59 | Admitting: Nurse Practitioner

## 2021-10-12 ENCOUNTER — Encounter: Payer: Self-pay | Admitting: Nurse Practitioner

## 2021-10-12 ENCOUNTER — Telehealth: Payer: Self-pay | Admitting: Nurse Practitioner

## 2021-10-12 ENCOUNTER — Ambulatory Visit (INDEPENDENT_AMBULATORY_CARE_PROVIDER_SITE_OTHER): Payer: Commercial Managed Care - PPO | Admitting: Nurse Practitioner

## 2021-10-12 ENCOUNTER — Other Ambulatory Visit: Payer: Self-pay

## 2021-10-12 VITALS — BP 136/78 | HR 98 | Temp 97.8°F | Ht 67.5 in | Wt 247.2 lb

## 2021-10-12 DIAGNOSIS — Z6838 Body mass index (BMI) 38.0-38.9, adult: Secondary | ICD-10-CM

## 2021-10-12 DIAGNOSIS — I10 Essential (primary) hypertension: Secondary | ICD-10-CM | POA: Diagnosis not present

## 2021-10-12 DIAGNOSIS — E559 Vitamin D deficiency, unspecified: Secondary | ICD-10-CM | POA: Diagnosis not present

## 2021-10-12 DIAGNOSIS — Z833 Family history of diabetes mellitus: Secondary | ICD-10-CM

## 2021-10-12 DIAGNOSIS — E876 Hypokalemia: Secondary | ICD-10-CM

## 2021-10-12 LAB — BASIC METABOLIC PANEL
BUN: 11 mg/dL (ref 6–23)
CO2: 34 mEq/L — ABNORMAL HIGH (ref 19–32)
Calcium: 9.4 mg/dL (ref 8.4–10.5)
Chloride: 102 mEq/L (ref 96–112)
Creatinine, Ser: 1.03 mg/dL (ref 0.40–1.20)
GFR: 78.47 mL/min (ref >60.00)
Glucose, Bld: 97 mg/dL (ref 70–99)
Potassium: 3.2 mEq/L — ABNORMAL LOW (ref 3.5–5.1)
Sodium: 139 mEq/L (ref 135–145)

## 2021-10-12 LAB — VITAMIN D 25 HYDROXY (VIT D DEFICIENCY, FRACTURES): VITD: 30.47 ng/mL (ref 30.00–100.00)

## 2021-10-12 LAB — HEMOGLOBIN A1C: Hgb A1c MFr Bld: 5.1 % (ref 4.6–6.5)

## 2021-10-12 MED ORDER — AMLODIPINE BESYLATE 5 MG PO TABS: 5.0000 mg | ORAL_TABLET | Freq: Every day | ORAL | 2 refills | Status: DC | PRN

## 2021-10-12 MED ORDER — TRIAMTERENE-HCTZ 37.5-25 MG PO TABS: ORAL_TABLET | ORAL | 1 refills | Status: DC

## 2021-10-12 MED ORDER — POTASSIUM CHLORIDE CRYS ER 20 MEQ PO TBCR: 20.0000 meq | EXTENDED_RELEASE_TABLET | Freq: Every day | ORAL | 0 refills | Status: DC

## 2021-10-12 NOTE — Assessment & Plan Note (Addendum)
Lost 14lbs in last 33months. She has maintain low sodium diet. No regular exercise regimen.  Wt Readings from Last 3 Encounters:  10/12/21 247 lb 3.2 oz (112.1 kg)  02/13/21 261 lb 12.8 oz (118.8 kg) (>99 %, Z= 2.59)*  12/19/20 260 lb 9.6 oz (118.2 kg) (>99 %, Z= 2.57)*   * Growth percentiles are based on CDC (Girls, 2-20 Years) data.   Advised to incorporate regular exercise ( per day) maintain heart healthy diet. Advised about the importance of regular exercise regimen.

## 2021-10-12 NOTE — Assessment & Plan Note (Signed)
Repeat HgbA1c: 5.2% (normal)

## 2021-10-12 NOTE — Assessment & Plan Note (Addendum)
Repeat vit. D: 30 Normal HgbA1c Low normal of Vit. D: start OTC multivitamin with at least 1000IU of vit. D.

## 2021-10-12 NOTE — Telephone Encounter (Signed)
Rx sent 

## 2021-10-12 NOTE — Patient Instructions (Signed)
Go to lab for blood draw. DASH diet and regular exercise is essential for weight loss and adequate BP control. Maintain current dose of BP medications till I review lab results. Bring BP machine to next appt. Monitor BP 3x/week in AM and record. Bring to next appt.  DASH Eating Plan DASH stands for Dietary Approaches to Stop Hypertension. The DASH eating plan is a healthy eating plan that has been shown to: Reduce high blood pressure (hypertension). Reduce your risk for type 2 diabetes, heart disease, and stroke. Help with weight loss. What are tips for following this plan? Reading food labels Check food labels for the amount of salt (sodium) per serving. Choose foods with less than 5 percent of the Daily Value of sodium. Generally, foods with less than 300 milligrams (mg) of sodium per serving fit into this eating plan. To find whole grains, look for the word "whole" as the first word in the ingredient list. Shopping Buy products labeled as "low-sodium" or "no salt added." Buy fresh foods. Avoid canned foods and pre-made or frozen meals. Cooking Avoid adding salt when cooking. Use salt-free seasonings or herbs instead of table salt or sea salt. Check with your health care provider or pharmacist before using salt substitutes. Do not fry foods. Cook foods using healthy methods such as baking, boiling, grilling, roasting, and broiling instead. Cook with heart-healthy oils, such as olive, canola, avocado, soybean, or sunflower oil. Meal planning  Eat a balanced diet that includes: 4 or more servings of fruits and 4 or more servings of vegetables each day. Try to fill one-half of your plate with fruits and vegetables. 6-8 servings of whole grains each day. Less than 6 oz (170 g) of lean meat, poultry, or fish each day. A 3-oz (85-g) serving of meat is about the same size as a deck of cards. One egg equals 1 oz (28 g). 2-3 servings of low-fat dairy each day. One serving is 1 cup (237 mL). 1  serving of nuts, seeds, or beans 5 times each week. 2-3 servings of heart-healthy fats. Healthy fats called omega-3 fatty acids are found in foods such as walnuts, flaxseeds, fortified milks, and eggs. These fats are also found in cold-water fish, such as sardines, salmon, and mackerel. Limit how much you eat of: Canned or prepackaged foods. Food that is high in trans fat, such as some fried foods. Food that is high in saturated fat, such as fatty meat. Desserts and other sweets, sugary drinks, and other foods with added sugar. Full-fat dairy products. Do not salt foods before eating. Do not eat more than 4 egg yolks a week. Try to eat at least 2 vegetarian meals a week. Eat more home-cooked food and less restaurant, buffet, and fast food. Lifestyle When eating at a restaurant, ask that your food be prepared with less salt or no salt, if possible. If you drink alcohol: Limit how much you use to: 0-1 drink a day for women who are not pregnant. 0-2 drinks a day for men. Be aware of how much alcohol is in your drink. In the [place] one drink equals one 12 oz bottle of beer (355 mL), one 5 oz glass of wine (148 mL), or one 1 oz glass of hard liquor (44 mL). General information Avoid eating more than 2,300 mg of salt a day. If you have hypertension, you may need to reduce your sodium intake to 1,500 mg a day. Work with your health care provider to maintain a healthy body weight  or to lose weight. Ask what an ideal weight is for you. Get at least 30 minutes of exercise that causes your heart to beat faster (aerobic exercise) most days of the week. Activities may include walking, swimming, or biking. Work with your health care provider or dietitian to adjust your eating plan to your individual calorie needs. What foods should I eat? Fruits All fresh, dried, or frozen fruit. Canned fruit in natural juice (without added sugar). Vegetables Fresh or frozen vegetables (raw, steamed, roasted, or  grilled). Low-sodium or reduced-sodium tomato and vegetable juice. Low-sodium or reduced-sodium tomato sauce and tomato paste. Low-sodium or reduced-sodium canned vegetables. Grains Whole-grain or whole-wheat bread. Whole-grain or whole-wheat pasta. Brown rice. Orpah Cobb. Bulgur. Whole-grain and low-sodium cereals. Pita bread. Low-fat, low-sodium crackers. Whole-wheat flour tortillas. Meats and other proteins Skinless chicken or [place]. Ground chicken or [place]. Pork with fat trimmed off. Fish and seafood. Egg whites. Dried beans, peas, or lentils. Unsalted nuts, nut butters, and seeds. Unsalted canned beans. Lean cuts of beef with fat trimmed off. Low-sodium, lean precooked or cured meat, such as sausages or meat loaves. Dairy Low-fat (1%) or fat-free (skim) milk. Reduced-fat, low-fat, or fat-free cheeses. Nonfat, low-sodium ricotta or cottage cheese. Low-fat or nonfat yogurt. Low-fat, low-sodium cheese. Fats and oils Soft margarine without trans fats. Vegetable oil. Reduced-fat, low-fat, or light mayonnaise and salad dressings (reduced-sodium). Canola, safflower, olive, avocado, soybean, and sunflower oils. Avocado. Seasonings and condiments Herbs. Spices. Seasoning mixes without salt. Other foods Unsalted popcorn and pretzels. Fat-free sweets. The items listed above may not be a complete list of foods and beverages you can eat. Contact a dietitian for more information. What foods should I avoid? Fruits Canned fruit in a light or heavy syrup. Fried fruit. Fruit in cream or butter sauce. Vegetables Creamed or fried vegetables. Vegetables in a cheese sauce. Regular canned vegetables (not low-sodium or reduced-sodium). Regular canned tomato sauce and paste (not low-sodium or reduced-sodium). Regular tomato and vegetable juice (not low-sodium or reduced-sodium). Rosita Fire. Olives. Grains Baked goods made with fat, such as croissants, muffins, or some breads. Dry pasta or rice meal packs. Meats  and other proteins Fatty cuts of meat. Ribs. Fried meat. Tomasa Blase. Bologna, salami, and other precooked or cured meats, such as sausages or meat loaves. Fat from the back of a pig (fatback). Bratwurst. Salted nuts and seeds. Canned beans with added salt. Canned or smoked fish. Whole eggs or egg yolks. Chicken or [place] with skin. Dairy Whole or 2% milk, cream, and half-and-half. Whole or full-fat cream cheese. Whole-fat or sweetened yogurt. Full-fat cheese. Nondairy creamers. Whipped toppings. Processed cheese and cheese spreads. Fats and oils Butter. Stick margarine. Lard. Shortening. Ghee. Bacon fat. Tropical oils, such as coconut, palm kernel, or palm oil. Seasonings and condiments Onion salt, garlic salt, seasoned salt, table salt, and sea salt. Worcestershire sauce. Tartar sauce. Barbecue sauce. Teriyaki sauce. Soy sauce, including reduced-sodium. Steak sauce. Canned and packaged gravies. Fish sauce. Oyster sauce. Cocktail sauce. Store-bought horseradish. Ketchup. Mustard. Meat flavorings and tenderizers. Bouillon cubes. Hot sauces. Pre-made or packaged marinades. Pre-made or packaged taco seasonings. Relishes. Regular salad dressings. Other foods Salted popcorn and pretzels. The items listed above may not be a complete list of foods and beverages you should avoid. Contact a dietitian for more information. Where to find more information National Heart, Lung, and Blood Institute: PopSteam.is American Heart Association: www.heart.org Academy of Nutrition and Dietetics: www.eatright.org National Kidney Foundation: www.kidney.org Summary The DASH eating plan is a healthy eating plan that  has been shown to reduce high blood pressure (hypertension). It may also reduce your risk for type 2 diabetes, heart disease, and stroke. When on the DASH eating plan, aim to eat more fresh fruits and vegetables, whole grains, lean proteins, low-fat dairy, and heart-healthy fats. With the DASH eating plan, you  should limit salt (sodium) intake to 2,300 mg a day. If you have hypertension, you may need to reduce your sodium intake to 1,500 mg a day. Work with your health care provider or dietitian to adjust your eating plan to your individual calorie needs. This information is not intended to replace advice given to you by your health care provider. Make sure you discuss any questions you have with your health care provider. Document Revised: 07/13/2019 Document Reviewed: 07/13/2019 Elsevier Patient Education  2022 [employer].

## 2021-10-12 NOTE — Progress Notes (Signed)
Subjective:  Patient ID: Hannah Hammond, female    DOB: 17-Apr-2001  Age: 21 y.o. MRN: [id number]  CC: Follow-up (6 month f/u on BP and vitamin d/)  HPI  Obesity Lost 14lbs in last 73months. She has maintain low sodium diet. No regular exercise regimen.  Wt Readings from Last 3 Encounters:  10/12/21 247 lb 3.2 oz (112.1 kg)  02/13/21 261 lb 12.8 oz (118.8 kg) (>99 %, Z= 2.59)*  12/19/20 260 lb 9.6 oz (118.2 kg) (>99 %, Z= 2.57)*   * Growth percentiles are based on CDC (Girls, 2-20 Years) data.   Advised to incorporate regular exercise ( per day) maintain heart healthy diet. Advised about the importance of regular exercise regimen.  Vitamin D deficiency Repeat vit. D: 30 Normal HgbA1c Low normal of Vit. D: start OTC multivitamin with at least 1000IU of vit. D.  HTN (hypertension), benign Reports home BP reading: 130s-150s/80s-90s Reports she is compliant with maxzide BP Readings from Last 3 Encounters:  10/12/21 136/78  02/13/21 124/90  12/19/20 118/62   Repeat BMP: Normal HgbA1c Normal renal function but low potassium due to increasing maxzide dose. Maintain dose at half tab once a day. Sent potassium supplement x 1week. Also sent amlodipine 5mg  (take if BP is >140/80) Maintain DASH diet Advised to add regular exercise F/up in 20month with BP readings and BP machine  Family history of diabetes mellitus (DM) Repeat HgbA1c: 5.2% (normal)  BP Readings from Last 3 Encounters:  10/12/21 136/78  02/13/21 124/90  12/19/20 118/62    Wt Readings from Last 3 Encounters:  10/12/21 247 lb 3.2 oz (112.1 kg)  02/13/21 261 lb 12.8 oz (118.8 kg) (>99 %, Z= 2.59)*  12/19/20 260 lb 9.6 oz (118.2 kg) (>99 %, Z= 2.57)*   * Growth percentiles are based on CDC (Girls, 2-20 Years) data.    Reviewed past Medical, Social and Family history today.  Outpatient Medications Prior to Visit  Medication Sig Dispense Refill   buPROPion (WELLBUTRIN) 75 MG tablet Take 1  tablet by mouth twice daily 180 tablet 0   nystatin cream (MYCOSTATIN) Apply 1 application topically 2 (two) times daily. 30 g 0   Vitamin D, Ergocalciferol, (DRISDOL) 1.25 MG (50000 UNIT) CAPS capsule TAKE 1 CAPSULE BY MOUTH EVERY 7 DAYS 8 capsule 0   triamterene-hydrochlorothiazide (MAXZIDE-25) 37.5-25 MG tablet Take 1/2 (one-half) tablet by mouth once daily 45 tablet 1   Facility-Administered Medications Prior to Visit  Medication Dose Route Frequency Provider Last Rate Last Admin   ketoconazole (NIZORAL) 2 % shampoo   Topical Once per day on Mon Thu 10-09-1988, Irma M, MD        ROS See HPI  Objective:  BP 136/78 (BP Location: Left Arm, Patient Position: Supine, Cuff Size: Large)    Pulse 98    Temp 97.8 F (36.6 C) (Temporal)    Ht 5' 7.5" (1.715 m)    Wt 247 lb 3.2 oz (112.1 kg)    LMP 09/22/2021 (Exact Date)    SpO2 99%    BMI 38.15 kg/m   Physical Exam Vitals reviewed.  Constitutional:      Appearance: She is obese.  Cardiovascular:     Rate and Rhythm: Normal rate and regular rhythm.     Pulses: Normal pulses.     Heart sounds: Normal heart sounds.  Pulmonary:     Effort: Pulmonary effort is normal.     Breath sounds: Normal breath sounds.  Musculoskeletal:     Right  lower leg: No edema.     Left lower leg: No edema.  Neurological:     Mental Status: She is alert and oriented to person, place, and time.   Assessment & Plan:  This visit occurred during the SARS-CoV-2 public health emergency.  Safety protocols were in place, including screening questions prior to the visit, additional usage of staff PPE, and extensive cleaning of exam room while observing appropriate contact time as indicated for disinfecting solutions.   Hannah Hammond was seen today for follow-up.  Diagnoses and all orders for this visit:  HTN (hypertension), benign -     Basic metabolic panel -     amLODipine (NORVASC) 5 MG tablet; Take 1 tablet (5 mg total) by mouth daily as needed. Take only if  BP>140/80 -     triamterene-hydrochlorothiazide (MAXZIDE-25) 37.5-25 MG tablet; Take 1/2 (one-half) tablet by mouth once daily  Vitamin D deficiency -     Vitamin D (25 hydroxy)  Class 2 severe obesity due to excess calories with serious comorbidity and body mass index (BMI) of 38.0 to 38.9 in adult (HCC) -     Hemoglobin A1c  Family history of diabetes mellitus (DM) -     Hemoglobin A1c  Hypokalemia -     potassium chloride SA (KLOR-CON M) 20 MEQ tablet; Take 1 tablet (20 mEq total) by mouth daily.   Problem List Items Addressed This Visit       Cardiovascular and Mediastinum   HTN (hypertension), benign - Primary    Reports home BP reading: 130s-150s/80s-90s Reports she is compliant with maxzide BP Readings from Last 3 Encounters:  10/12/21 136/78  02/13/21 124/90  12/19/20 118/62   Repeat BMP: Normal HgbA1c Normal renal function but low potassium due to increasing maxzide dose. Maintain dose at half tab once a day. Sent potassium supplement x 1week. Also sent amlodipine 5mg  (take if BP is >140/80) Maintain DASH diet Advised to add regular exercise F/up in 75month with BP readings and BP machine      Relevant Medications   amLODipine (NORVASC) 5 MG tablet   triamterene-hydrochlorothiazide (MAXZIDE-25) 37.5-25 MG tablet   Other Relevant Orders   Basic metabolic panel (Completed)     Other   Family history of diabetes mellitus (DM)    Repeat HgbA1c: 5.2% (normal)      Relevant Orders   Hemoglobin A1c (Completed)   Obesity    Lost 14lbs in last 54months. She has maintain low sodium diet. No regular exercise regimen.  Wt Readings from Last 3 Encounters:  10/12/21 247 lb 3.2 oz (112.1 kg)  02/13/21 261 lb 12.8 oz (118.8 kg) (>99 %, Z= 2.59)*  12/19/20 260 lb 9.6 oz (118.2 kg) (>99 %, Z= 2.57)*   * Growth percentiles are based on CDC (Girls, 2-20 Years) data.   Advised to incorporate regular exercise (12/21/20 per day) maintain heart healthy diet. Advised about  the importance of regular exercise regimen.      Relevant Orders   Hemoglobin A1c (Completed)   Vitamin D deficiency    Repeat vit. D: 30 Normal HgbA1c Low normal of Vit. D: start OTC multivitamin with at least 1000IU of vit. D.      Relevant Orders   Vitamin D (25 hydroxy) (Completed)   Other Visit Diagnoses     Hypokalemia       Relevant Medications   potassium chloride SA (KLOR-CON M) 20 MEQ tablet       Follow-up: Return in about 4 weeks (around 11/09/2021)  for HTN.  Wilfred Lacy, NP

## 2021-10-12 NOTE — Assessment & Plan Note (Addendum)
Reports home BP reading: 130s-150s/80s-90s Reports she is compliant with maxzide BP Readings from Last 3 Encounters:  10/12/21 136/78  02/13/21 124/90  12/19/20 118/62   Repeat BMP: Normal HgbA1c Normal renal function but low potassium due to increasing maxzide dose. Maintain dose at half tab once a day. Sent potassium supplement x 1week. Also sent amlodipine 5mg  (take if BP is >140/80) Maintain DASH diet Advised to add regular exercise F/up in 39month with BP readings and BP machine

## 2021-10-12 NOTE — Telephone Encounter (Signed)
[facility] [phone] Pharmacy is needing to know if it's ok for potassium chloride SA (KLOR-CON M) 20 MEQ tablet [[id number]]  and triamterene-hydrochlorothiazide (MAXZIDE-25) 37.5-25 MG tablet [[id number]]   to be taken together. They could raise her potassium levels.  [phone]

## 2021-10-13 NOTE — Telephone Encounter (Signed)
Please see Hannah Hammond's full note about the drug interaction. Pharmacy called again concerned.

## 2021-10-16 ENCOUNTER — Telehealth: Payer: Self-pay | Admitting: Nurse Practitioner

## 2021-10-16 NOTE — Telephone Encounter (Signed)
Pt mother Azzure Garabedian requesting a caqll back from [place] to discuss something, callback is [phone]

## 2021-10-16 NOTE — Telephone Encounter (Signed)
She asked if it could please be today

## 2021-10-19 NOTE — Telephone Encounter (Signed)
Spoke with mom and confirmed lab results and instructions.

## 2021-11-09 ENCOUNTER — Ambulatory Visit: Payer: Commercial Managed Care - PPO | Admitting: Nurse Practitioner

## 2021-11-09 ENCOUNTER — Telehealth: Payer: Self-pay | Admitting: Nurse Practitioner

## 2021-11-09 NOTE — Telephone Encounter (Signed)
Pt did not show up for her 11/09/21 OV with Baldo Ash, I sent her a no show letter. ?

## 2021-11-18 NOTE — Telephone Encounter (Signed)
1st no show, fee waived, letter sent KO   

## 2021-11-19 ENCOUNTER — Other Ambulatory Visit: Payer: Self-pay | Admitting: Nurse Practitioner

## 2021-11-19 NOTE — Telephone Encounter (Signed)
Chart supports Rx ?Last seen 08/2021 ?Next OV 12/11/21 ?

## 2021-12-07 ENCOUNTER — Telehealth (INDEPENDENT_AMBULATORY_CARE_PROVIDER_SITE_OTHER): Payer: Commercial Managed Care - PPO | Admitting: Nurse Practitioner

## 2021-12-07 ENCOUNTER — Telehealth: Payer: Self-pay | Admitting: Nurse Practitioner

## 2021-12-07 ENCOUNTER — Encounter: Payer: Self-pay | Admitting: Nurse Practitioner

## 2021-12-07 VITALS — BP 142/98 | HR 89 | Temp 97.7°F | Ht 67.5 in | Wt 247.0 lb

## 2021-12-07 DIAGNOSIS — J069 Acute upper respiratory infection, unspecified: Secondary | ICD-10-CM

## 2021-12-07 DIAGNOSIS — J208 Acute bronchitis due to other specified organisms: Secondary | ICD-10-CM

## 2021-12-07 MED ORDER — HYDROCODONE BIT-HOMATROP MBR 5-1.5 MG/5ML PO SOLN: 5.0000 mL | Freq: Four times a day (QID) | ORAL | 0 refills | Status: DC | PRN

## 2021-12-07 MED ORDER — SALINE SPRAY 0.65 % NA SOLN: 1.0000 | NASAL | 0 refills | Status: DC | PRN

## 2021-12-07 MED ORDER — FLUTICASONE PROPIONATE 50 MCG/ACT NA SUSP: 2.0000 | Freq: Every day | NASAL | 0 refills | Status: DC

## 2021-12-07 NOTE — Patient Instructions (Signed)
Call office if no improvement in 4days. ?Complete a home COVID test. ? ?URI Instructions: ?Flonase and Afrin use: apply 1spray of afrin in each nare, wait , then apply 2sprays of flonase in each nare. Use both nasal spray consecutively x 3days, then flonase only for at least 14days. ? ?Encourage adequate oral hydration. ? ?Avoid decongestants if you have high blood pressure. ?Use" Delsym" or" Robitussin" cough syrup varietis for cough.  ?You can use plain "Tylenol" or "Advi"l for fever, chills and achyness. ? ? "Common cold" symptoms are usually triggered by a virus.  The antibiotics are usually not necessary. On average, a" viral cold" illness would take 4-10 days to resolve. Please, make an appointment if you are not better or if you're worse.  ?

## 2021-12-07 NOTE — Telephone Encounter (Signed)
Pts mom calling concerned for her daughter's sickness.. thinking she needs something stronger prescribed .. bc she thinks she has a respiratory infection.  ?

## 2021-12-07 NOTE — Progress Notes (Signed)
Virtual Visit via Video Note ? ?I connected withNAME@ on 12/07/21 at  9:00 AM EDT by a video enabled telemedicine application and verified that I am speaking with the correct person using two identifiers. ? ?Location: ?Patient:Home ?Provider: Office ?Participants: patient and provider ? ?I discussed the limitations of evaluation and management by telemedicine and the availability of in person appointments. ?I also discussed with the patient that there may be a patient responsible charge related to this service. The patient expressed understanding and agreed to proceed. ? ?CC:Pt c/o nasal congestion, dry cough, ear pain, sore throat, runny nose with dark green mucus x 4-5 days. Pt has tried otc medication (cold and ibuprofen meds). ? ?History of Present Illness: ?URI  ?This is a new problem. The current episode started in the past 7 days. The problem has been unchanged. The maximum temperature recorded prior to her arrival was 100.4 - 100.9 F. The fever has been present for Less than 1 day. Associated symptoms include congestion, coughing, headaches, joint pain, rhinorrhea, sinus pain, sneezing and a sore throat. Pertinent negatives include no abdominal pain, chest pain, diarrhea, dysuria, ear pain, joint swelling, nausea, neck pain, plugged ear sensation, rash, swollen glands, vomiting or wheezing. She has tried acetaminophen, decongestant, NSAIDs and increased fluids for the symptoms. The treatment provided mild relief.   ?Roommate with similar symptoms after attending a school event. ? ?Observations/Objective: ?Physical Exam ?Vitals reviewed.  ?Constitutional:   ?   General: She is not in acute distress. ?Cardiovascular:  ?   Pulses: Normal pulses.  ?Pulmonary:  ?   Effort: Pulmonary effort is normal.  ?Neurological:  ?   Mental Status: She is alert and oriented to person, place, and time.  ?  ?Assessment and Plan: ?Hannah Hammond was seen today for acute visit. ? ?Diagnoses and all orders for this visit: ? ?Viral upper  respiratory tract infection ?-     HYDROcodone bit-homatropine (HYCODAN) 5-1.5 MG/5ML syrup; Take 5 mLs by mouth every 6 (six) hours as needed for cough. ?-     fluticasone (FLONASE) 50 MCG/ACT nasal spray; Place 2 sprays into both nostrils daily. ?-     sodium chloride (OCEAN) 0.65 % SOLN nasal spray; Place 1 spray into both nostrils as needed for congestion. ? ? ?Follow Up Instructions: ?Call office if no improvement in 4days. ?Complete a home COVID test. ?URI Instructions: ?Flonase and Afrin use: apply 1spray of afrin in each nare, wait 46mins, then apply 2sprays of flonase in each nare. Use both nasal spray consecutively x 3days, then flonase only for at least 14days. ?Encourage adequate oral hydration. ?Avoid decongestants if you have high blood pressure. ?Use" Delsym" or" Robitussin" cough syrup varietis for cough.  ?You can use plain "Tylenol" or "Advi"l for fever, chills and achyness. ?  ?I discussed the assessment and treatment plan with the patient. The patient was provided an opportunity to ask questions and all were answered. The patient agreed with the plan and demonstrated an understanding of the instructions. ?  ?The patient was advised to call back or seek an in-person evaluation if the symptoms worsen or if the condition fails to improve as anticipated. ? ?Wilfred Lacy, NP  ?

## 2021-12-08 MED ORDER — PREDNISONE 20 MG PO TABS: ORAL_TABLET | ORAL | 0 refills | Status: AC

## 2021-12-08 MED ORDER — ALBUTEROL SULFATE HFA 108 (90 BASE) MCG/ACT IN AERS: 1.0000 | INHALATION_SPRAY | Freq: Four times a day (QID) | RESPIRATORY_TRACT | 0 refills | Status: DC | PRN

## 2021-12-08 NOTE — Telephone Encounter (Signed)
Pt's mom is checking on status of this message. She would like a call back concerning her daughter. Please advise  ?

## 2021-12-08 NOTE — Telephone Encounter (Signed)
Please advise 

## 2021-12-08 NOTE — Telephone Encounter (Signed)
LVM for patient to return call. 

## 2021-12-08 NOTE — Addendum Note (Signed)
Addended by: Michaela Corner on: 12/08/2021 04:36 PM ? ? Modules accepted: Orders ? ?

## 2021-12-11 ENCOUNTER — Ambulatory Visit: Payer: Commercial Managed Care - PPO | Admitting: Nurse Practitioner

## 2021-12-11 ENCOUNTER — Telehealth: Payer: Self-pay

## 2021-12-11 ENCOUNTER — Telehealth: Payer: Self-pay | Admitting: Nurse Practitioner

## 2021-12-11 NOTE — Telephone Encounter (Signed)
? ?  Caller Name: paula Shadoan ? ?Caller Ph #: [phone] ? ?Date of APPT: 12/11/21 ? ?Reason given for no show/late cancellation: pt did not feel good, she was being seen for  not feeling good ? ?No Show Letter printed & put in outgoing mail (Yes/No): yes ? ? ? ? ?

## 2021-12-11 NOTE — Telephone Encounter (Signed)
Spoke to pharmacy, regarding Ventolin HFA inhaler being Non-Formulary, her insurance will cover the ProAir.  Gave verbal to change medication. Dm/cma ? ?

## 2021-12-24 ENCOUNTER — Ambulatory Visit: Payer: Commercial Managed Care - PPO | Admitting: Nurse Practitioner

## 2021-12-29 NOTE — Telephone Encounter (Signed)
2nd "no show"/late cancel ? ?Fee generated - letter sent ?

## 2021-12-31 ENCOUNTER — Ambulatory Visit (INDEPENDENT_AMBULATORY_CARE_PROVIDER_SITE_OTHER): Payer: Commercial Managed Care - PPO | Admitting: Nurse Practitioner

## 2021-12-31 ENCOUNTER — Encounter: Payer: Self-pay | Admitting: Nurse Practitioner

## 2021-12-31 VITALS — BP 126/76 | HR 84 | Temp 97.3°F | Ht 67.5 in | Wt 243.6 lb

## 2021-12-31 DIAGNOSIS — E559 Vitamin D deficiency, unspecified: Secondary | ICD-10-CM

## 2021-12-31 DIAGNOSIS — E876 Hypokalemia: Secondary | ICD-10-CM | POA: Diagnosis not present

## 2021-12-31 DIAGNOSIS — I10 Essential (primary) hypertension: Secondary | ICD-10-CM

## 2021-12-31 LAB — BASIC METABOLIC PANEL
BUN: 10 mg/dL (ref 6–23)
CO2: 29 mEq/L (ref 19–32)
Calcium: 9.1 mg/dL (ref 8.4–10.5)
Chloride: 102 mEq/L (ref 96–112)
Creatinine, Ser: 1.03 mg/dL (ref 0.40–1.20)
GFR: 78.35 mL/min (ref >60.00)
Glucose, Bld: 87 mg/dL (ref 70–99)
Potassium: 3.3 mEq/L — ABNORMAL LOW (ref 3.5–5.1)
Sodium: 139 mEq/L (ref 135–145)

## 2021-12-31 MED ORDER — VITAMIN D3 25 MCG (1000 UT) PO CAPS: 1000.0000 [IU] | ORAL_CAPSULE | Freq: Every day | ORAL | Status: AC

## 2021-12-31 MED ORDER — AMLODIPINE BESYLATE 5 MG PO TABS: 5.0000 mg | ORAL_TABLET | Freq: Every day | ORAL | 3 refills | Status: AC | PRN

## 2021-12-31 NOTE — Assessment & Plan Note (Addendum)
BP at goal ?BP Readings from Last 3 Encounters:  ?12/31/21 126/76  ?12/07/21 (!) 142/98  ?10/12/21 136/78  ? ?Maintain amlodipine and maxzide dose ?Repeat BMP due to previous hypokalemia. ?F/up in 61months or sooner if needed ?

## 2021-12-31 NOTE — Progress Notes (Signed)
? ?             Established Patient Visit ? ?Patient: Hannah Hammond   DOB: 30-Nov-2000   21 y.o. Female  MRN: [id number] ?Visit Date: 12/31/2021 ? ?Subjective:  ?  ?Chief Complaint  ?Patient presents with  ? Office Visit  ?  F/u on HTN. ?Pt states she takes her BP twice a day and it stays around 127/84. But last week when on her Cycle, it tends to stay around the 131/93 range. ?No other concerns  ? ?HPI ?HTN (hypertension), benign ?BP at goal ?BP Readings from Last 3 Encounters:  ?12/31/21 126/76  ?12/07/21 (!) 142/98  ?10/12/21 136/78  ? ?Maintain amlodipine and maxzide dose ?Repeat BMP due to previous hypokalemia. ?F/up in 21months or sooner if needed ? ?Wt Readings from Last 3 Encounters:  ?12/31/21 243 lb 9.6 oz (110.5 kg)  ?12/07/21 247 lb (112 kg)  ?10/12/21 247 lb 3.2 oz (112.1 kg)  ?  ?Reviewed medical, surgical, and social history today ? ?Medications: ?Outpatient Medications Prior to Visit  ?Medication Sig  ? buPROPion (WELLBUTRIN) 75 MG tablet Take 1 tablet by mouth twice daily  ? nystatin cream (MYCOSTATIN) Apply 1 application topically 2 (two) times daily.  ? triamterene-hydrochlorothiazide (MAXZIDE-25) 37.5-25 MG tablet Take 1/2 (one-half) tablet by mouth once daily  ? [DISCONTINUED] amLODipine (NORVASC) 5 MG tablet Take 1 tablet (5 mg total) by mouth daily as needed. Take only if BP>140/80  ? [DISCONTINUED] albuterol (VENTOLIN HFA) 108 (90 Base) MCG/ACT inhaler Inhale 1-2 puffs into the lungs every 6 (six) hours as needed for wheezing or shortness of breath. (Patient not taking: Reported on 12/31/2021)  ? [DISCONTINUED] fluticasone (FLONASE) 50 MCG/ACT nasal spray Place 2 sprays into both nostrils daily. (Patient not taking: Reported on 12/31/2021)  ? [DISCONTINUED] HYDROcodone bit-homatropine (HYCODAN) 5-1.5 MG/5ML syrup Take 5 mLs by mouth every 6 (six) hours as needed for cough. (Patient not taking: Reported on 12/31/2021)  ? [DISCONTINUED] sodium chloride (OCEAN) 0.65 % SOLN nasal spray Place 1  spray into both nostrils as needed for congestion. (Patient not taking: Reported on 12/31/2021)  ? [DISCONTINUED] Vitamin D, Ergocalciferol, (DRISDOL) 1.25 MG (50000 UNIT) CAPS capsule TAKE 1 CAPSULE BY MOUTH EVERY 7 DAYS (Patient not taking: Reported on 12/31/2021)  ? ?Facility-Administered Medications Prior to Visit  ?Medication Dose Route Frequency Provider  ? ketoconazole (NIZORAL) 2 % shampoo   Topical Once per day on Mon Thu Lezlie Lye, Meda Coffee, MD  ? ?Reviewed past medical and social history.  ? ?ROS per HPI above ? ? ?   ?Objective:  ?BP 126/76 (BP Location: Left Arm, Patient Position: Sitting, Cuff Size: Normal)   Pulse 84   Temp (!) 97.3 ?F (36.3 ?C) (Temporal)   Ht 5' 7.5" (1.715 m)   Wt 243 lb 9.6 oz (110.5 kg)   SpO2 98%   BMI 37.59 kg/m?  ? ?  ? ?Physical Exam ?Cardiovascular:  ?   Rate and Rhythm: Normal rate and regular rhythm.  ?   Pulses: Normal pulses.  ?   Heart sounds: Normal heart sounds.  ?Pulmonary:  ?   Effort: Pulmonary effort is normal.  ?   Breath sounds: Normal breath sounds.  ?Musculoskeletal:  ?   Right lower leg: No edema.  ?   Left lower leg: No edema.  ?Neurological:  ?   Mental Status: She is alert and oriented to person, place, and time.  ?  ?No results found for any visits on 12/31/21. ?   ?  Assessment & Plan:  ?  ?Problem List Items Addressed This Visit   ? ?  ? Cardiovascular and Mediastinum  ? HTN (hypertension), benign - Primary  ?  BP at goal ?BP Readings from Last 3 Encounters:  ?12/31/21 126/76  ?12/07/21 (!) 142/98  ?10/12/21 136/78  ? ?Maintain amlodipine and maxzide dose ?Repeat BMP due to previous hypokalemia. ?F/up in 21months or sooner if needed ?  ?  ? Relevant Medications  ? amLODipine (NORVASC) 5 MG tablet  ? Other Relevant Orders  ? Basic metabolic panel  ?  ? Other  ? Vitamin D deficiency  ? Relevant Medications  ? Cholecalciferol (VITAMIN D3) 25 MCG (1000 UT) CAPS  ? ?Other Visit Diagnoses   ? ? Hypokalemia      ? Relevant Orders  ? Basic metabolic panel   ? ?  ? ?Return in about 6 months (around 07/03/2022) for HTN. ? ?  ? ?Alysia Penna, NP ? ? ?

## 2021-12-31 NOTE — Patient Instructions (Signed)
Maintain current medication dose ?Start vit. D 1000IU daily from over the counter ?Go to lab. ?

## 2022-01-03 MED ORDER — POTASSIUM CHLORIDE CRYS ER 20 MEQ PO TBCR: 20.0000 meq | EXTENDED_RELEASE_TABLET | Freq: Every day | ORAL | 0 refills | Status: DC

## 2022-01-03 NOTE — Addendum Note (Signed)
Addended by: Michaela Corner on: 01/03/2022 06:07 PM ? ? Modules accepted: Orders ? ?

## 2022-01-05 ENCOUNTER — Other Ambulatory Visit: Payer: Self-pay | Admitting: Nurse Practitioner

## 2022-01-05 ENCOUNTER — Telehealth: Payer: Self-pay | Admitting: Nurse Practitioner

## 2022-01-05 NOTE — Telephone Encounter (Signed)
Caller Name: Mykeria Garman (mother) ?Call back phone #: [phone] ? ?MEDICATION(S): buPROPion (WELLBUTRIN) 75 MG tablet ? ?triamterene-hydrochlorothiazide (MAXZIDE-25) 37.5-25 MG tablet ? ?Days of Med Remaining: 0 ? ? ? ?~~~Please advise patient/caregiver to allow 2-3 business days to process RX refills.  ?

## 2022-01-06 NOTE — Telephone Encounter (Signed)
Chart supports rx refill ?Last ov: 12/31/21 ?Last refill: 12/09/21 ?

## 2022-01-11 ENCOUNTER — Other Ambulatory Visit: Payer: Self-pay

## 2022-01-11 DIAGNOSIS — I10 Essential (primary) hypertension: Secondary | ICD-10-CM

## 2022-01-11 MED ORDER — TRIAMTERENE-HCTZ 37.5-25 MG PO TABS: ORAL_TABLET | ORAL | 1 refills | Status: DC

## 2022-01-11 NOTE — Telephone Encounter (Signed)
triamterene-hydrochlorothiazide (MAXZIDE-25) 37.5-25 MG tablet [[id number]]    Order Details Dose, Route, Frequency: As Directed  Dispense Quantity: 45 tablet Refills: 1        Sig: Take 1/2 (one-half) tablet by mouth once daily       Start Date: 01/11/22 End Date: --  Written Date: 01/11/22 Expiration Date: 01/11/23     Diagnosis Association: HTN (hypertension), benign (I10)  Original Order:  triamterene-hydrochlorothiazide (MAXZIDE-25) 37.5-25 MG tablet [[id number]]  Providers  Ordering and Authorizing Provider:   Anne Ng, NP  [address] [place], [place] [address]  Phone:  [phone]   Fax:  [phone]  DEA #:  [id number]   NPI:  [id number]      Ordering User:  Trudee Kuster, CMA       Pharmacy  [facility] [address] [place] - [address] [place], [place] [address]  Phone:  [phone]  Fax:  [phone]  DEA #:  --  DAW Reason: --

## 2022-01-12 ENCOUNTER — Telehealth: Payer: Self-pay | Admitting: Nurse Practitioner

## 2022-01-12 NOTE — Telephone Encounter (Signed)
Chart supports Rx Medication sent on 01/11/22 Last seen 12/31/21 Due for f/u in 06/2022

## 2022-01-12 NOTE — Telephone Encounter (Signed)
Pt needing all meds sent in for 6 months (not due back until November 2023).   1 - triamterene-hydrochlorothiazide (MAXZIDE-25) 37.5-25 MG tablet 2 - buPROPion (WELLBUTRIN) 75 MG tablet 3 - potassium chloride SA (KLOR-CON M) 20 MEQ tablet  4 - amLODipine (NORVASC) 5 MG tablet   Mother called in for pt

## 2022-01-12 NOTE — Telephone Encounter (Signed)
All medications recently filled in 12/2021. Will refill medications when appropriate.

## 2022-02-02 ENCOUNTER — Other Ambulatory Visit: Payer: Self-pay | Admitting: Nurse Practitioner

## 2022-02-03 NOTE — Telephone Encounter (Signed)
Chart Supports Rx Last OV: 12/2021 Next OV not yet scheduled

## 2022-02-23 ENCOUNTER — Other Ambulatory Visit: Payer: Self-pay | Admitting: Nurse Practitioner

## 2022-02-24 NOTE — Telephone Encounter (Signed)
Chart Supports Rx Last OV: 12/2021 Next OV: not scheduled  Needs appt for further refills.

## 2022-03-22 ENCOUNTER — Encounter: Payer: Self-pay | Admitting: Nurse Practitioner

## 2022-03-22 ENCOUNTER — Ambulatory Visit (INDEPENDENT_AMBULATORY_CARE_PROVIDER_SITE_OTHER): Payer: Commercial Managed Care - PPO | Admitting: Nurse Practitioner

## 2022-03-22 VITALS — BP 138/80 | HR 88 | Temp 97.8°F | Ht 67.5 in | Wt 258.4 lb

## 2022-03-22 DIAGNOSIS — E559 Vitamin D deficiency, unspecified: Secondary | ICD-10-CM | POA: Diagnosis not present

## 2022-03-22 DIAGNOSIS — E876 Hypokalemia: Secondary | ICD-10-CM | POA: Diagnosis not present

## 2022-03-22 DIAGNOSIS — I10 Essential (primary) hypertension: Secondary | ICD-10-CM | POA: Diagnosis not present

## 2022-03-22 DIAGNOSIS — D500 Iron deficiency anemia secondary to blood loss (chronic): Secondary | ICD-10-CM

## 2022-03-22 LAB — BASIC METABOLIC PANEL
BUN: 9 mg/dL (ref 6–23)
CO2: 25 mEq/L (ref 19–32)
Calcium: 8.8 mg/dL (ref 8.4–10.5)
Chloride: 104 mEq/L (ref 96–112)
Creatinine, Ser: 0.96 mg/dL (ref 0.40–1.20)
GFR: 85.12 mL/min (ref >60.00)
Glucose, Bld: 87 mg/dL (ref 70–99)
Potassium: 3.6 mEq/L (ref 3.5–5.1)
Sodium: 138 mEq/L (ref 135–145)

## 2022-03-22 LAB — CBC
HCT: 36.8 % (ref 36.0–46.0)
Hemoglobin: 12.1 g/dL (ref 12.0–15.0)
MCHC: 32.8 g/dL (ref 30.0–36.0)
MCV: 81.2 fl (ref 78.0–100.0)
Platelets: 211 10*3/uL (ref 150.0–400.0)
RBC: 4.53 Mil/uL (ref 3.87–5.11)
RDW: 14.3 % (ref 11.5–14.6)
WBC: 8.6 10*3/uL (ref 4.5–10.5)

## 2022-03-22 LAB — IBC + FERRITIN
Ferritin: 17.5 ng/mL (ref 10.0–291.0)
Iron: 65 ug/dL (ref 42–145)
Saturation Ratios: 16.2 % — ABNORMAL LOW (ref 20.0–50.0)
TIBC: 401.8 ug/dL (ref 250.0–450.0)
Transferrin: 287 mg/dL (ref 212.0–360.0)

## 2022-03-22 MED ORDER — POTASSIUM CHLORIDE CRYS ER 20 MEQ PO TBCR: 20.0000 meq | EXTENDED_RELEASE_TABLET | Freq: Every day | ORAL | 1 refills | Status: DC

## 2022-03-22 MED ORDER — TRIAMTERENE-HCTZ 37.5-25 MG PO TABS: ORAL_TABLET | ORAL | 1 refills | Status: DC

## 2022-03-22 NOTE — Assessment & Plan Note (Addendum)
reports intermittent dizziness. Menstrual cycle 5-6days, heavy flow for first 2days, no clots, associated with severe cramping.  Check CBC and iron today Advised to use ibuprofen prn

## 2022-03-22 NOTE — Progress Notes (Signed)
Established Patient Visit  Patient: Hannah Hammond   DOB: November 20, 2000   21 y.o. Female  MRN: [id number] Visit Date: 03/22/2022  Subjective:    Chief Complaint  Patient presents with   Office Visit    F/u on BP medication  Checks BP 3 x a day  No concern s   HPI HTN (hypertension), benign BP at goal with maxzide and potassium Use of amlodipine 5mg  daily prn if BP>140/90 Home BP reading: 110 /70-120/80 BP Readings from Last 3 Encounters:  03/22/22 138/80  12/31/21 126/76  12/07/21 (!) 142/98   Maintain med dose Repeat BMP: normal  Iron deficiency anemia due to chronic blood loss reports intermittent dizziness. Menstrual cycle 5-6days, heavy flow for first 2days, no clots, associated with severe cramping.  Check CBC and iron today Advised to use ibuprofen prn   Reviewed medical, surgical, and social history today  Medications: Outpatient Medications Prior to Visit  Medication Sig   amLODipine (NORVASC) 5 MG tablet Take 1 tablet (5 mg total) by mouth daily as needed. Take only if BP>140/80   buPROPion (WELLBUTRIN) 75 MG tablet Take 1 tablet by mouth twice daily   Cholecalciferol (VITAMIN D3) 25 MCG (1000 UT) CAPS Take 1 capsule (1,000 Units total) by mouth daily.   nystatin cream (MYCOSTATIN) Apply 1 application topically 2 (two) times daily.   [DISCONTINUED] potassium chloride SA (KLOR-CON M) 20 MEQ tablet Take 1 tablet (20 mEq total) by mouth daily.   [DISCONTINUED] triamterene-hydrochlorothiazide (MAXZIDE-25) 37.5-25 MG tablet Take 1/2 (one-half) tablet by mouth once daily   Facility-Administered Medications Prior to Visit  Medication Dose Route Frequency Provider   ketoconazole (NIZORAL) 2 % shampoo   Topical Once per day on Mon Thu 10-09-1988, Lezlie Lye, MD   Reviewed past medical and social history.   ROS per HPI above      Objective:  BP 138/80 (BP Location: Left Arm, Patient Position: Sitting, Cuff Size: Large)   Pulse 88   Temp  97.8 F (36.6 C) (Temporal)   Ht 5' 7.5" (1.715 m)   Wt 258 lb 6.4 oz (117.2 kg)   SpO2 99%   BMI 39.87 kg/m      Physical Exam Cardiovascular:     Rate and Rhythm: Normal rate and regular rhythm.     Pulses: Normal pulses.     Heart sounds: Normal heart sounds.  Musculoskeletal:     Right lower leg: No edema.     Left lower leg: No edema.  Neurological:     Mental Status: She is alert and oriented to person, place, and time.     Results for orders placed or performed in visit on 03/22/22  Basic metabolic panel  Result Value Ref Range   Sodium 138 135 - 145 mEq/L   Potassium 3.6 3.5 - 5.1 mEq/L   Chloride 104 96 - 112 mEq/L   CO2 25 19 - 32 mEq/L   Glucose, Bld 87 70 - 99 mg/dL   BUN 9 6 - 23 mg/dL   Creatinine, Ser 03/24/22 0.40 - 1.20 mg/dL   GFR 9.73 53.29 mL/min   Calcium 8.8 8.4 - 10.5 mg/dL  CBC  Result Value Ref Range   WBC 8.6 4.5 - 10.5 K/uL   RBC 4.53 3.87 - 5.11 Mil/uL   Platelets 211.0 150.0 - 400.0 K/uL   Hemoglobin 12.1 12.0 - 15.0 g/dL   HCT >92.42 68.3 - 41.9 %  MCV 81.2 78.0 - 100.0 fl   MCHC 32.8 30.0 - 36.0 g/dL   RDW 44.0 34.7 - 42.5 %  IBC + Ferritin  Result Value Ref Range   Iron 65 42 - 145 ug/dL   Transferrin 956.3 875.6 - 360.0 mg/dL   Saturation Ratios 43.3 (L) 20.0 - 50.0 %   Ferritin 17.5 10.0 - 291.0 ng/mL   TIBC 401.8 250.0 - 450.0 mcg/dL      Assessment & Plan:    Problem List Items Addressed This Visit       Cardiovascular and Mediastinum   HTN (hypertension), benign - Primary    BP at goal with maxzide and potassium Use of amlodipine 5mg  daily prn if BP>140/90 Home BP reading: 110 /70-120/80 BP Readings from Last 3 Encounters:  03/22/22 138/80  12/31/21 126/76  12/07/21 (!) 142/98   Maintain med dose Repeat BMP: normal      Relevant Medications   triamterene-hydrochlorothiazide (MAXZIDE-25) 37.5-25 MG tablet   Other Relevant Orders   Basic metabolic panel (Completed)     Other   Iron deficiency anemia due to  chronic blood loss    reports intermittent dizziness. Menstrual cycle 5-6days, heavy flow for first 2days, no clots, associated with severe cramping.  Check CBC and iron today Advised to use ibuprofen prn      Relevant Orders   CBC (Completed)   IBC + Ferritin (Completed)   Vitamin D deficiency   Other Visit Diagnoses     Hypokalemia       Relevant Medications   potassium chloride SA (KLOR-CON M) 20 MEQ tablet      Return in about 6 months (around 09/22/2022) for CPE (fasting).     09/24/2022, NP

## 2022-03-22 NOTE — Patient Instructions (Signed)
Maintain current medications Go to lab Maintain daily exercise and her healthy diet.  How to Increase Your Level of Physical Activity Getting regular physical activity is important for your overall health and well-being. Most people do not get enough exercise. There are easy ways to increase your level of physical activity, even if you have not been very active in the past or if you are just starting out. What are the benefits of physical activity? Physical activity has many short-term and long-term benefits. Being active on a regular basis can improve your physical and mental health as well as provide other benefits. Physical health benefits Helping you lose weight or maintain a healthy weight. Strengthening your muscles and bones. Reducing your risk of certain long-term (chronic) diseases, including heart disease, cancer, and diabetes. Being able to move around more easily and for longer periods of time without getting tired (increased endurance or stamina). Improving your ability to fight off illness (enhanced immunity). Being able to sleep better. Helping you stay healthy as you get older, including: Helping you stay mobile, or capable of walking and moving around. Preventing accidents, such as falls. Increasing life expectancy. Mental health benefits Boosting your mood and improving your self-esteem. Lowering your chance of having mental health problems, such as depression or anxiety. Helping you feel good about your body. Other benefits Finding new sources of fun and enjoyment. Meeting new people who share a common interest. Before you begin If you have a chronic illness or have not been active for a while, check with your health care provider about how to get started. Ask your health care provider what activities are safe for you. Start out slowly. Walking or doing some simple chair exercises is a good place to start, especially if you have not been active before or for a long  time. Set goals that you can work toward. Ask your health care provider how much exercise is best for you. In general, most adults should: Do moderate-intensity exercise for at least 150 minutes each week (30 minutes on most days of the week) or vigorous exercise for at least 75 minutes each week, or a combination of these. Moderate-intensity exercise can include walking at a quick pace, biking, yoga, water aerobics, or gardening. Vigorous exercise involves activities that take more effort, such as jogging or running, playing sports, swimming laps, or jumping rope. Do strength exercises on at least 2 days each week. This can include weight lifting, body weight exercises, and resistance-band exercises. How to be more physically active Make a plan  Try to find activities that you enjoy. You are more likely to commit to an exercise routine if it does not feel like a chore. If you have bone or joint problems, choose low-impact exercises, like walking or swimming. Use these tips for being successful with an exercise plan: Find a workout partner for accountability. Join a group or class, such as an aerobics class, cycling class, or sports team. Make family time active. Go for a walk, bike, or swim. Include a variety of exercises each week. Consider using a fitness tracker, such as a mobile phone app or a device worn like a watch, that will count the number of steps you take each day. Many people strive to reach 10,000 steps a day. Find ways to be active in your daily routines Besides your formal exercise plans, you can find ways to do physical activity during your daily routines, such as: Walking or biking to work or to the store. Taking  the stairs instead of the elevator. Parking farther away from the door at work or at the store. Planning walking meetings. Walking around while you are on the phone. Where to find more information Centers for Disease Control and Prevention:  [employer].pt President's Council on Fitness, Sports & Nutrition: www.fitness.gov ChooseMyPlate: http://www.harvey.com/ Contact a health care provider if: You have headaches, muscle aches, or joint pain that is concerning. You feel dizzy or light-headed while exercising. You faint. You feel your heart skipping, racing, or fluttering. You have chest pain while exercising. Summary Exercise benefits your mind and body at any age, even if you are just starting out. If you have a chronic illness or have not been active for a while, check with your health care provider before increasing your physical activity. Choose activities that are safe and enjoyable for you. Ask your health care provider what activities are safe for you. Start slowly. Tell your health care provider if you have problems as you start to increase your activity level. This information is not intended to replace advice given to you by your health care provider. Make sure you discuss any questions you have with your health care provider. Document Revised: 12/05/2020 Document Reviewed: 12/05/2020 Elsevier Patient Education  2023 [employer].

## 2022-03-22 NOTE — Assessment & Plan Note (Addendum)
BP at goal with maxzide and potassium Use of amlodipine 5mg  daily prn if BP>140/90 Home BP reading: 110 /70-120/80 BP Readings from Last 3 Encounters:  03/22/22 138/80  12/31/21 126/76  12/07/21 (!) 142/98   Maintain med dose Repeat BMP: normal

## 2022-04-11 ENCOUNTER — Other Ambulatory Visit: Payer: Self-pay | Admitting: Nurse Practitioner

## 2022-04-12 NOTE — Telephone Encounter (Signed)
Chart supports Rx Last OV: 02/2022 Next OV: 09/2022  

## 2022-04-30 ENCOUNTER — Ambulatory Visit: Payer: Commercial Managed Care - PPO | Admitting: Nurse Practitioner

## 2022-04-30 ENCOUNTER — Encounter: Payer: Self-pay | Admitting: Nurse Practitioner

## 2022-04-30 ENCOUNTER — Telehealth: Payer: Self-pay | Admitting: Nurse Practitioner

## 2022-04-30 NOTE — Telephone Encounter (Signed)
mother called Hannah Hammond and stated pt doesn't feel good and wouldn't be in, 3rd no show, I believe there may be extenuating circumstances, please advise if you want me to send final warning or dismissal

## 2022-04-30 NOTE — Telephone Encounter (Signed)
Sent final warning via mail and MyChart

## 2022-05-13 ENCOUNTER — Other Ambulatory Visit: Payer: Self-pay | Admitting: Nurse Practitioner

## 2022-05-13 NOTE — Telephone Encounter (Signed)
Chart supports Rx Last OV: 02/2022 Next OV: 09/2022  

## 2022-05-14 ENCOUNTER — Other Ambulatory Visit: Payer: Self-pay | Admitting: Nurse Practitioner

## 2022-05-18 ENCOUNTER — Encounter: Payer: Self-pay | Admitting: Nurse Practitioner

## 2022-05-18 ENCOUNTER — Ambulatory Visit (INDEPENDENT_AMBULATORY_CARE_PROVIDER_SITE_OTHER): Payer: Commercial Managed Care - PPO | Admitting: Nurse Practitioner

## 2022-05-18 ENCOUNTER — Other Ambulatory Visit: Payer: Self-pay | Admitting: Nurse Practitioner

## 2022-05-18 VITALS — BP 126/78 | HR 89 | Temp 97.6°F | Ht 67.0 in | Wt 252.0 lb

## 2022-05-18 DIAGNOSIS — E876 Hypokalemia: Secondary | ICD-10-CM

## 2022-05-18 DIAGNOSIS — Z23 Encounter for immunization: Secondary | ICD-10-CM | POA: Diagnosis not present

## 2022-05-18 DIAGNOSIS — F339 Major depressive disorder, recurrent, unspecified: Secondary | ICD-10-CM | POA: Diagnosis not present

## 2022-05-18 DIAGNOSIS — I10 Essential (primary) hypertension: Secondary | ICD-10-CM | POA: Diagnosis not present

## 2022-05-18 MED ORDER — BUPROPION HCL 75 MG PO TABS: 75.0000 mg | ORAL_TABLET | Freq: Two times a day (BID) | ORAL | 11 refills | Status: AC

## 2022-05-18 MED ORDER — POTASSIUM CHLORIDE CRYS ER 20 MEQ PO TBCR: 20.0000 meq | EXTENDED_RELEASE_TABLET | Freq: Every day | ORAL | 5 refills | Status: DC

## 2022-05-18 MED ORDER — TRIAMTERENE-HCTZ 37.5-25 MG PO TABS: 0.5000 | ORAL_TABLET | Freq: Every day | ORAL | 5 refills | Status: DC

## 2022-05-18 NOTE — Patient Instructions (Signed)
Maintain upcoming appt. Continue to monitor BP 2-3x/week in AM Maintain DASH diet and daily exercise.

## 2022-05-18 NOTE — Progress Notes (Signed)
Established Patient Visit  Patient: Hannah Hammond   DOB: Apr 19, 2001   21 y.o. Female  MRN: [id number] Visit Date: 05/18/2022  Subjective:    Chief Complaint  Patient presents with   Office Visit    BP / depression refill Checks BP 2 times a day  No other concerns  Flu shot given today    HPI HTN (hypertension), benign Home BP reading: 117/78, 125/82, 127/83, 128/86, 129/86, 137/95, 120/80, 125/87 No exercise regimen Maintains low sodium diet. BP Readings from Last 3 Encounters:  05/18/22 126/78  03/22/22 138/80  12/31/21 126/76   Maintain Maxzide and potassium dose Refill sent  Depression, recurrent (HCC) Stable mood with wellbutrin Refill sent  Exercise: none Diet: regular     05/18/2022   10:29 AM 10/12/2021   10:28 AM 07/31/2020    8:09 AM  Depression screen PHQ 2/9  Decreased Interest 0 0 0  Down, Depressed, Hopeless 0 0 0  PHQ - 2 Score 0 0 0  Altered sleeping 2    Tired, decreased energy 0    Change in appetite 0    Feeling bad or failure about yourself  0    Trouble concentrating 2    Moving slowly or fidgety/restless 0    Suicidal thoughts 0    PHQ-9 Score 4    Difficult doing work/chores Not difficult at all         05/18/2022   10:29 AM  GAD 7 : Generalized Anxiety Score  Nervous, Anxious, on Edge 0  Control/stop worrying 0  Worry too much - different things 0  Trouble relaxing 0  Restless 0  Easily annoyed or irritable 0  Afraid - awful might happen 0  Total GAD 7 Score 0  Anxiety Difficulty Not difficult at all   Reviewed medical, surgical, and social history today  Medications: Outpatient Medications Prior to Visit  Medication Sig   amLODipine (NORVASC) 5 MG tablet Take 1 tablet (5 mg total) by mouth daily as needed. Take only if BP>140/80   nystatin cream (MYCOSTATIN) Apply 1 application topically 2 (two) times daily.   [DISCONTINUED] buPROPion (WELLBUTRIN) 75 MG tablet TAKE 1 TABLET BY MOUTH TWICE DAILY .  APPOINTMENT REQUIRED FOR FUTURE REFILLS   [DISCONTINUED] triamterene-hydrochlorothiazide (MAXZIDE-25) 37.5-25 MG tablet Take 1/2 (one-half) tablet by mouth once daily   Cholecalciferol (VITAMIN D3) 25 MCG (1000 UT) CAPS Take 1 capsule (1,000 Units total) by mouth daily. (Patient not taking: Reported on 05/18/2022)   [DISCONTINUED] potassium chloride SA (KLOR-CON M) 20 MEQ tablet Take 1 tablet (20 mEq total) by mouth daily. (Patient not taking: Reported on 05/18/2022)   Facility-Administered Medications Prior to Visit  Medication Dose Route Frequency Provider   ketoconazole (NIZORAL) 2 % shampoo   Topical Once per day on Mon Thu Lezlie Lye, Meda Coffee, MD   Reviewed past medical and social history.   ROS per HPI above      Objective:  BP 126/78 (BP Location: Left Arm)   Pulse 89   Temp 97.6 F (36.4 C) (Temporal)   Ht 5\' 7"  (1.702 m)   Wt 252 lb (114.3 kg)   SpO2 99%   BMI 39.47 kg/m      Physical Exam Cardiovascular:     Rate and Rhythm: Normal rate.     Pulses: Normal pulses.  Pulmonary:     Effort: Pulmonary effort is normal.  Musculoskeletal:  Right lower leg: No edema.     Left lower leg: No edema.  Neurological:     Mental Status: She is alert and oriented to person, place, and time.     No results found for any visits on 05/18/22.    Assessment & Plan:    Problem List Items Addressed This Visit       Cardiovascular and Mediastinum   HTN (hypertension), benign    Home BP reading: 117/78, 125/82, 127/83, 128/86, 129/86, 137/95, 120/80, 125/87 No exercise regimen Maintains low sodium diet. BP Readings from Last 3 Encounters:  05/18/22 126/78  03/22/22 138/80  12/31/21 126/76   Maintain Maxzide and potassium dose Refill sent      Relevant Medications   triamterene-hydrochlorothiazide (MAXZIDE-25) 37.5-25 MG tablet     Other   Depression, recurrent (HCC) - Primary    Stable mood with wellbutrin Refill sent      Relevant Medications   buPROPion  (WELLBUTRIN) 75 MG tablet   Other Visit Diagnoses     Hypokalemia       Relevant Medications   potassium chloride SA (KLOR-CON M) 20 MEQ tablet   Need for immunization against influenza       Relevant Orders   Flu Vaccine QUAD 6+ mos PF IM (Fluarix Quad PF) (Completed)      Return for maintain upcoming appt in February.     Wilfred Lacy, NP

## 2022-05-18 NOTE — Assessment & Plan Note (Addendum)
Home BP reading: 117/78, 125/82, 127/83, 128/86, 129/86, 137/95, 120/80, 125/87 No exercise regimen Maintains low sodium diet. BP Readings from Last 3 Encounters:  05/18/22 126/78  03/22/22 138/80  12/31/21 126/76   Maintain Maxzide and potassium dose Refill sent

## 2022-05-18 NOTE — Assessment & Plan Note (Signed)
Stable mood with wellbutrin Refill sent

## 2022-08-13 ENCOUNTER — Encounter: Payer: Self-pay | Admitting: Nurse Practitioner

## 2022-08-13 ENCOUNTER — Ambulatory Visit (INDEPENDENT_AMBULATORY_CARE_PROVIDER_SITE_OTHER): Payer: Commercial Managed Care - PPO | Admitting: Nurse Practitioner

## 2022-08-13 VITALS — BP 140/86 | HR 97 | Temp 97.8°F | Ht 67.0 in | Wt 252.8 lb

## 2022-08-13 DIAGNOSIS — E559 Vitamin D deficiency, unspecified: Secondary | ICD-10-CM | POA: Diagnosis not present

## 2022-08-13 DIAGNOSIS — I10 Essential (primary) hypertension: Secondary | ICD-10-CM | POA: Diagnosis not present

## 2022-08-13 DIAGNOSIS — F339 Major depressive disorder, recurrent, unspecified: Secondary | ICD-10-CM | POA: Diagnosis not present

## 2022-08-13 LAB — BASIC METABOLIC PANEL
BUN: 10 mg/dL (ref 6–23)
CO2: 28 mEq/L (ref 19–32)
Calcium: 9.3 mg/dL (ref 8.4–10.5)
Chloride: 103 mEq/L (ref 96–112)
Creatinine, Ser: 0.94 mg/dL (ref 0.40–1.20)
GFR: 87.06 mL/min (ref >60.00)
Glucose, Bld: 76 mg/dL (ref 70–99)
Potassium: 3.9 mEq/L (ref 3.5–5.1)
Sodium: 138 mEq/L (ref 135–145)

## 2022-08-13 NOTE — Progress Notes (Unsigned)
                Established Patient Visit  Patient: Hannah Hammond   DOB: Mar 29, 2001   21 y.o. Female  MRN: [id number] Visit Date: 08/13/2022  Subjective:    Chief Complaint  Patient presents with   Office Visit    Med refill  Checks BP daily  C/o trouble sleeping, interested in Calm-Aid  Increased anxiety    HPI No problem-specific Assessment & Plan notes found for this encounter. Home BP reaadinf 119/84, 127/84, 135/91, 140/94.  Reviewed medical, surgical, and social history today  Medications: Outpatient Medications Prior to Visit  Medication Sig   amLODipine (NORVASC) 5 MG tablet Take 1 tablet (5 mg total) by mouth daily as needed. Take only if BP>140/80   buPROPion (WELLBUTRIN) 75 MG tablet Take 1 tablet (75 mg total) by mouth 2 (two) times daily.   nystatin cream (MYCOSTATIN) APPLY 1 APPLICATION TOPICALLY TO AFFECTED AREA TWICE DAILY   potassium chloride SA (KLOR-CON M) 20 MEQ tablet Take 1 tablet (20 mEq total) by mouth daily.   triamterene-hydrochlorothiazide (MAXZIDE-25) 37.5-25 MG tablet Take 0.5 tablets by mouth daily. Take 1/2 (one-half) tablet by mouth once daily   Cholecalciferol (VITAMIN D3) 25 MCG (1000 UT) CAPS Take 1 capsule (1,000 Units total) by mouth daily. (Patient not taking: Reported on 05/18/2022)   Facility-Administered Medications Prior to Visit  Medication Dose Route Frequency Provider   ketoconazole (NIZORAL) 2 % shampoo   Topical Once per day on Mon Thu Lezlie Lye, Meda Coffee, MD   Reviewed past medical and social history.   ROS per HPI above  {Show previous labs (optional):23779}    Objective:  BP (!) 144/86 (BP Location: Right Arm, Patient Position: Sitting, Cuff Size: Normal)   Pulse 97   Temp 97.8 F (36.6 C) (Temporal)   Ht 5\' 7"  (1.702 m)   Wt 252 lb 12.8 oz (114.7 kg)   SpO2 99%   BMI 39.59 kg/m      Physical Exam  No results found for any visits on 08/13/22.    Assessment & Plan:    Problem List Items Addressed This  Visit   None  No follow-ups on file.     08/15/22, NP

## 2022-08-13 NOTE — Patient Instructions (Signed)
Go to lab Maintain current med doses. Take amlodipine 5mg  if BP remains at 140/80 or greater. Maintain low sodium diet and daily exercise.

## 2022-08-15 ENCOUNTER — Encounter: Payer: Self-pay | Admitting: Nurse Practitioner

## 2022-08-15 MED ORDER — TRIAMTERENE-HCTZ 37.5-25 MG PO TABS: 0.5000 | ORAL_TABLET | Freq: Every day | ORAL | 5 refills | Status: AC

## 2022-08-15 NOTE — Assessment & Plan Note (Signed)
Stable mood with wellbutrin Reports intermittent insomnia and increased anxiety during exam week. No SI/HI/hallucination  Maintain wellbutrin dose Ok to use calm OTC supplement at hs F/up in 2months

## 2022-08-15 NOTE — Assessment & Plan Note (Signed)
BP not at goal Current use of maxzide daily Reports she is compliant with diet and exercise BP Readings from Last 3 Encounters:  08/13/22 (!) 140/86  05/18/22 126/78  03/22/22 138/80    Advised to take amlodipine as prescribed Maintain maxzide dose Repeat BMP: normal F/up in 68months

## 2022-09-21 ENCOUNTER — Other Ambulatory Visit (HOSPITAL_COMMUNITY): Payer: Self-pay

## 2022-09-23 ENCOUNTER — Encounter: Payer: Self-pay | Admitting: Nurse Practitioner

## 2022-09-23 ENCOUNTER — Telehealth: Payer: Self-pay | Admitting: Nurse Practitioner

## 2022-09-23 NOTE — Telephone Encounter (Signed)
4th no show in 12 months, mother called 8:38a asking what time appt was for & was advised pt missed appt  Pt was sent final warning letter in Sept 2023  Please advise if you want to proceed with Dismissal

## 2022-09-23 NOTE — Telephone Encounter (Signed)
Oversleot forgot the time. No show 2/1

## 2022-09-27 ENCOUNTER — Encounter: Payer: Self-pay | Admitting: Nurse Practitioner

## 2022-09-27 ENCOUNTER — Other Ambulatory Visit (HOSPITAL_COMMUNITY): Payer: Self-pay

## 2022-09-27 ENCOUNTER — Telehealth: Payer: Self-pay

## 2022-09-27 NOTE — Telephone Encounter (Signed)
Generated dismissal 

## 2022-09-27 NOTE — Telephone Encounter (Signed)
Pharmacy Patient Advocate Encounter   Received notification from [facility] that prior authorization for Bupropion 75mg  tabs is required/requested.    PA submitted on 09/27/22 to (ins) [employer] via [email or web address]   Phone # [phone]  Status is pending

## 2022-10-01 NOTE — Telephone Encounter (Signed)
Pharmacy Patient Advocate Encounter  Received notification from [employer] that the request for prior authorization for Bupropion has been denied due to .    Please be advised we currently do not have a Pharmacist to review denials, therefore you will need to process appeals accordingly as needed. Thanks for your support at this time.

## 2022-10-14 ENCOUNTER — Ambulatory Visit
Admission: EM | Admit: 2022-10-14 | Discharge: 2022-10-14 | Disposition: A | Discharge status: Home / Self Care | Payer: Self-pay | Attending: Family Medicine | Admitting: Family Medicine

## 2022-10-14 DIAGNOSIS — R0789 Other chest pain: Secondary | ICD-10-CM

## 2022-10-14 DIAGNOSIS — F419 Anxiety disorder, unspecified: Secondary | ICD-10-CM

## 2022-10-14 NOTE — Discharge Instructions (Signed)
Chest pain ever becomes more persistent you can take Tylenol or ibuprofen as needed.  The EKG is normal and does not show any of anything serious going on.  Please follow-up with primary care and I do think counseling could help you some.

## 2022-10-14 NOTE — ED Provider Notes (Addendum)
[facility]    CSN: [id number] Arrival date & time: 10/14/22  1534      History   Chief Complaint Chief Complaint  Patient presents with   Chest Pain    HPI Hannah Hammond is a 22 y.o. female.    Chest Pain  Here for chest pain that is been bothering her in the last week.  And will happen up to 3 times a day.  He will be sharp or achy and then will gradually recede and go away within a minute or 2.  No associated diaphoresis or nausea /vomiting or palpitations.  No shortness of breath and it is not pleuritic.  No recent URI symptoms or fever.  Last menstrual cycle was February 1.  She is on amlodipine for blood pressure.  Past Medical History:  Diagnosis Date   Hypertension     Patient Active Problem List   Diagnosis Date Noted   Depression, recurrent ([place]) 05/18/2022   HTN (hypertension), benign 12/19/2020   Obesity 12/19/2020   Family history of diabetes mellitus (DM) 12/19/2020   Iron deficiency anemia due to chronic blood loss 12/19/2020   Vitamin D deficiency 12/19/2020    History reviewed. No pertinent surgical history.  OB History   No obstetric history on file.      Home Medications    Prior to Admission medications   Medication Sig Start Date End Date Taking? Authorizing Provider  amLODipine (NORVASC) 5 MG tablet Take 1 tablet (5 mg total) by mouth daily as needed. Take only if BP>140/80 12/31/21   Nche, Charlene Brooke, NP  buPROPion (WELLBUTRIN) 75 MG tablet Take 1 tablet (75 mg total) by mouth 2 (two) times daily. 05/18/22   Nche, Charlene Brooke, NP  Cholecalciferol (VITAMIN D3) 25 MCG (1000 UT) CAPS Take 1 capsule (1,000 Units total) by mouth daily. Patient not taking: Reported on 05/18/2022 12/31/21   Nche, Charlene Brooke, NP  nystatin cream (MYCOSTATIN) APPLY 1 APPLICATION TOPICALLY TO AFFECTED AREA TWICE DAILY 05/18/22   Nche, Charlene Brooke, NP  triamterene-hydrochlorothiazide (MAXZIDE-25) 37.5-25 MG tablet Take 0.5 tablets by  mouth daily. Take 1/2 (one-half) tablet by mouth once daily 08/15/22   Nche, Charlene Brooke, NP    Family History Family History  Problem Relation Age of Onset   Hypertension Mother    Vitamin D deficiency Mother    Diabetes Mellitus II Father    Hyperlipidemia Father    Diabetes Father     Social History Social History   Tobacco Use   Smoking status: Never   Smokeless tobacco: Never  Vaping Use   Vaping Use: Never used  Substance Use Topics   Alcohol use: No   Drug use: Never     Allergies   Patient has no known allergies.   Review of Systems Review of Systems  Cardiovascular:  Positive for chest pain.     Physical Exam Triage Vital Signs ED Triage Vitals [10/14/22 1555]  Enc Vitals Group     BP (!) 146/91     Pulse Rate 80     Resp 16     Temp 98.2 F (36.8 C)     Temp Source Oral     SpO2 98 %     Weight      Height      Head Circumference      Peak Flow      Pain Score 0     Pain Loc      Pain Edu?  Excl. in [place]?    No data found.  Updated Vital Signs BP (!) 146/91 (BP Location: Left Arm)   Pulse 80   Temp 98.2 F (36.8 C) (Oral)   Resp 16   LMP 09/23/2022 (Exact Date)   SpO2 98%   Visual Acuity Right Eye Distance:   Left Eye Distance:   Bilateral Distance:    Right Eye Near:   Left Eye Near:    Bilateral Near:     Physical Exam Vitals reviewed.  Constitutional:      General: She is not in acute distress.    Appearance: She is not ill-appearing, toxic-appearing or diaphoretic.  HENT:     Mouth/Throat:     Mouth: Mucous membranes are moist.  Eyes:     Extraocular Movements: Extraocular movements intact.     Conjunctiva/sclera: Conjunctivae normal.     Pupils: Pupils are equal, round, and reactive to light.  Cardiovascular:     Rate and Rhythm: Normal rate and regular rhythm.     Heart sounds: No murmur heard. Pulmonary:     Effort: Pulmonary effort is normal. No respiratory distress.     Breath sounds: Normal breath  sounds. No stridor. No wheezing, rhonchi or rales.     Comments: She is just a little bit tender on her left anterior upper chest.  No rash. Musculoskeletal:     Cervical back: Neck supple.  Lymphadenopathy:     Cervical: No cervical adenopathy.  Skin:    Coloration: Skin is not jaundiced or pale.  Neurological:     General: No focal deficit present.     Mental Status: She is alert and oriented to person, place, and time.  Psychiatric:        Behavior: Behavior normal.      UC Treatments / Results  Labs (all labs ordered are listed, but only abnormal results are displayed) Labs Reviewed - No data to display  EKG   Radiology No results found.  Procedures Procedures (including critical care time)  Medications Ordered in UC Medications - No data to display  Initial Impression / Assessment and Plan / UC Course  I have reviewed the triage vital signs and the nursing notes.  Pertinent labs & imaging results that were available during my care of the patient were reviewed by me and considered in my medical decision making (see chart for details).       She does have a good bit of anxiety and admits to some stress.  We discussed her going to counseling to help with time management and anxiety reducing techniques.  Then mom and patient request an EKG.  EKG is benign without any ST segment changes.  Reassurance is attempted and I have asked her to please seek counseling for her anxiety. Final Clinical Impressions(s) / UC Diagnoses   Final diagnoses:  Atypical chest pain  Anxiety     Discharge Instructions      Chest pain ever becomes more persistent you can take Tylenol or ibuprofen as needed.  The EKG is normal and does not show any of anything serious going on.  Please follow-up with primary care and I do think counseling could help you some.     ED Prescriptions   None    PDMP not reviewed this encounter.   Barrett Henle, MD 10/14/22 1634     Barrett Henle, MD 10/14/22 [phone]

## 2022-10-14 NOTE — ED Triage Notes (Signed)
Pt states CP on and off for one week.  States it is her upper chest sometimes on the left,sometimes on the right and that it is an aching pain.

## 2023-01-11 ENCOUNTER — Other Ambulatory Visit: Payer: Self-pay | Admitting: Nephrology

## 2023-01-11 DIAGNOSIS — E669 Obesity, unspecified: Secondary | ICD-10-CM

## 2023-01-11 DIAGNOSIS — F419 Anxiety disorder, unspecified: Secondary | ICD-10-CM

## 2023-01-11 DIAGNOSIS — I10 Essential (primary) hypertension: Secondary | ICD-10-CM

## 2023-02-10 ENCOUNTER — Other Ambulatory Visit: Payer: Self-pay

## 2023-03-01 ENCOUNTER — Other Ambulatory Visit: Payer: Self-pay

## 2023-03-08 ENCOUNTER — Ambulatory Visit: Admission: RE | Admit: 2023-03-08 | Payer: Self-pay | Source: Ambulatory Visit

## 2023-03-22 ENCOUNTER — Other Ambulatory Visit: Payer: Self-pay

## 2023-04-07 ENCOUNTER — Other Ambulatory Visit: Payer: Self-pay

## 2023-08-23 ENCOUNTER — Other Ambulatory Visit: Payer: Self-pay

## 2023-08-30 ENCOUNTER — Other Ambulatory Visit: Payer: Self-pay

## 2023-09-01 ENCOUNTER — Ambulatory Visit
Admission: RE | Admit: 2023-09-01 | Discharge: 2023-09-01 | Disposition: A | Discharge status: Home / Self Care | Payer: Commercial Managed Care - PPO | Source: Ambulatory Visit | Attending: Nephrology | Admitting: Nephrology

## 2023-09-01 DIAGNOSIS — E669 Obesity, unspecified: Secondary | ICD-10-CM

## 2023-09-01 DIAGNOSIS — I10 Essential (primary) hypertension: Secondary | ICD-10-CM

## 2023-09-01 DIAGNOSIS — F419 Anxiety disorder, unspecified: Secondary | ICD-10-CM
